# Patient Record
Sex: Female | Born: 1980
Health system: Southern US, Community
[De-identification: ages and names within clinical notes are randomized; demographics above are authoritative.]

## PROBLEM LIST (undated history)

## (undated) DIAGNOSIS — I10 Essential (primary) hypertension: Secondary | ICD-10-CM

## (undated) DIAGNOSIS — J45909 Unspecified asthma, uncomplicated: Secondary | ICD-10-CM

## (undated) DIAGNOSIS — D649 Anemia, unspecified: Secondary | ICD-10-CM

## (undated) DIAGNOSIS — K259 Gastric ulcer, unspecified as acute or chronic, without hemorrhage or perforation: Secondary | ICD-10-CM

## (undated) HISTORY — DX: Anemia, unspecified: D64.9

## (undated) HISTORY — DX: Unspecified asthma, uncomplicated: J45.909

## (undated) HISTORY — DX: Essential (primary) hypertension: I10

## (undated) HISTORY — PX: ABDOMINAL HYSTERECTOMY: SHX81

## (undated) HISTORY — DX: Gastric ulcer, unspecified as acute or chronic, without hemorrhage or perforation: K25.9

---

## 2019-03-15 ENCOUNTER — Ambulatory Visit (INDEPENDENT_AMBULATORY_CARE_PROVIDER_SITE_OTHER): Payer: No Typology Code available for payment source | Admitting: Medical

## 2019-03-15 ENCOUNTER — Other Ambulatory Visit: Payer: Self-pay

## 2019-03-15 ENCOUNTER — Encounter: Payer: Self-pay | Admitting: Medical

## 2019-03-15 VITALS — BP 134/87 | HR 72 | Temp 98.5°F | Resp 16 | Ht 66.0 in | Wt 214.8 lb

## 2019-03-15 DIAGNOSIS — E66811 Obesity, class 1: Secondary | ICD-10-CM

## 2019-03-15 DIAGNOSIS — J309 Allergic rhinitis, unspecified: Secondary | ICD-10-CM | POA: Diagnosis not present

## 2019-03-15 DIAGNOSIS — I10 Essential (primary) hypertension: Secondary | ICD-10-CM

## 2019-03-15 DIAGNOSIS — R739 Hyperglycemia, unspecified: Secondary | ICD-10-CM

## 2019-03-15 DIAGNOSIS — L309 Dermatitis, unspecified: Secondary | ICD-10-CM | POA: Diagnosis not present

## 2019-03-15 DIAGNOSIS — D649 Anemia, unspecified: Secondary | ICD-10-CM | POA: Diagnosis not present

## 2019-03-15 DIAGNOSIS — J4520 Mild intermittent asthma, uncomplicated: Secondary | ICD-10-CM | POA: Diagnosis not present

## 2019-03-15 DIAGNOSIS — E669 Obesity, unspecified: Secondary | ICD-10-CM

## 2019-03-15 LAB — COMPREHENSIVE METABOLIC PANEL
ALT: 77 U/L — ABNORMAL HIGH (ref 0–35)
AST: 49 U/L — ABNORMAL HIGH (ref 0–37)
Albumin: 4.2 g/dL (ref 3.5–5.2)
Alkaline Phosphatase: 118 U/L — ABNORMAL HIGH (ref 39–117)
BUN: 9 mg/dL (ref 6–23)
CO2: 27 mEq/L (ref 19–32)
Calcium: 9.3 mg/dL (ref 8.4–10.5)
Chloride: 105 mEq/L (ref 96–112)
Creatinine, Ser: 0.72 mg/dL (ref 0.40–1.20)
GFR: 109.71 mL/min (ref >60.00)
Glucose, Bld: 91 mg/dL (ref 70–99)
Potassium: 4.4 mEq/L (ref 3.5–5.1)
Sodium: 140 mEq/L (ref 135–145)
Total Bilirubin: 0.4 mg/dL (ref 0.2–1.2)
Total Protein: 7.3 g/dL (ref 6.0–8.3)

## 2019-03-15 LAB — LIPID PANEL
Cholesterol: 140 mg/dL (ref 0–200)
HDL: 49.3 mg/dL (ref >39.00)
LDL Cholesterol: 70 mg/dL (ref 0–99)
NonHDL: 90.98
Total CHOL/HDL Ratio: 3
Triglycerides: 103 mg/dL (ref 0.0–149.0)
VLDL: 20.6 mg/dL (ref 0.0–40.0)

## 2019-03-15 LAB — CBC WITH DIFFERENTIAL/PLATELET
Basophils Absolute: 0 10*3/uL (ref 0.0–0.1)
Basophils Relative: 0.4 % (ref 0.0–3.0)
Eosinophils Absolute: 0 10*3/uL (ref 0.0–0.7)
Eosinophils Relative: 0.5 % (ref 0.0–5.0)
HCT: 39.1 % (ref 36.0–46.0)
Hemoglobin: 12.7 g/dL (ref 12.0–15.0)
Lymphocytes Relative: 24.3 % (ref 12.0–46.0)
Lymphs Abs: 1.7 10*3/uL (ref 0.7–4.0)
MCHC: 32.3 g/dL (ref 30.0–36.0)
MCV: 84.1 fl (ref 78.0–100.0)
Monocytes Absolute: 0.4 10*3/uL (ref 0.1–1.0)
Monocytes Relative: 6.1 % (ref 3.0–12.0)
Neutro Abs: 4.9 10*3/uL (ref 1.4–7.7)
Neutrophils Relative %: 68.7 % (ref 43.0–77.0)
Platelets: 211 10*3/uL (ref 150.0–400.0)
RBC: 4.66 Mil/uL (ref 3.87–5.11)
RDW: 13.7 % (ref 11.5–15.5)
WBC: 7.1 10*3/uL (ref 4.0–10.5)

## 2019-03-15 LAB — HEMOGLOBIN A1C: Hgb A1c MFr Bld: 6.1 % (ref 4.6–6.5)

## 2019-03-15 MED ORDER — FLUTICASONE-SALMETEROL 500-50 MCG/DOSE IN AEPB: 1.0000 | INHALATION_SPRAY | Freq: Two times a day (BID) | RESPIRATORY_TRACT | 11 refills | Status: DC

## 2019-03-15 MED ORDER — HYDROCHLOROTHIAZIDE 25 MG PO TABS: 25.0000 mg | ORAL_TABLET | Freq: Every day | ORAL | 11 refills | Status: DC

## 2019-03-15 MED ORDER — LORATADINE 10 MG PO TABS: 10.0000 mg | ORAL_TABLET | Freq: Every day | ORAL | 11 refills | Status: AC

## 2019-03-15 MED ORDER — METFORMIN HCL 500 MG PO TABS: 500.0000 mg | ORAL_TABLET | Freq: Two times a day (BID) | ORAL | 3 refills | Status: DC

## 2019-03-15 MED ORDER — TRIAMCINOLONE ACETONIDE 0.1 % EX CREA: 1.0000 "application " | TOPICAL_CREAM | Freq: Two times a day (BID) | CUTANEOUS | 3 refills | Status: AC

## 2019-03-15 MED ORDER — ALBUTEROL SULFATE HFA 108 (90 BASE) MCG/ACT IN AERS: 2.0000 | INHALATION_SPRAY | Freq: Four times a day (QID) | RESPIRATORY_TRACT | 3 refills | Status: DC | PRN

## 2019-03-15 NOTE — Addendum Note (Signed)
Addended by: Anabel Halon on: 03/15/2019 10:39 AM   Modules accepted: Orders

## 2019-03-15 NOTE — Progress Notes (Signed)
Subjective:    Patient ID: Destiny Goodman, female    DOB: 1980-09-22, 38 y.o.   MRN: [id number]  HPI  Pt in for first time.  Pt former pcp in [place]. Moved from there in March. [employer] from [place].  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 Pt does exercise 3-5 miles 3 times a week.   Pt states overall feels well.   One of hx of eczema. With flares will get antecubital rash, popliteal rash and occasional rash on eye brow and neck. Pt used to use some triamcinolone in the past with flares.  Pt also has hx of asthma. She has no current inhaler. She was on adviar and abluterol. She ran out of advair last Friday.   Pt has htn. Bp today 134/87. Pt was on hctz in the past. BP ran out past march.  Pt is obese and in the past was on phentermine. She was on that for 7 months. She lost down to 183 in past. But then she gained the weight back. Pt highest weight recently at 214.  Hx of anemia when had stomach ulcer.  Pt had hysterectomy in the past due to endometriosis.  Allergic rhinitis. Worst is during weather changes.   Review of Systems  Constitutional: Negative for chills, fatigue and fever.  Respiratory: Negative for cough, chest tightness, shortness of breath and wheezing.   Cardiovascular: Negative for chest pain and palpitations.  Gastrointestinal: Negative for abdominal pain.  Genitourinary: Negative for dysuria and frequency.  Musculoskeletal: Negative for back pain, joint swelling and neck pain.  Skin: Negative for rash.       Hx of eczema.  Neurological: Negative for dizziness.  Hematological: Negative for adenopathy. Does not bruise/bleed easily.  Psychiatric/Behavioral: Negative for behavioral problems, decreased concentration and suicidal ideas. The patient is not nervous/anxious.     Past Medical History:  Diagnosis Date  . Anemia   . Asthma      Social History   Socioeconomic History  . Marital  status: Single    Spouse name: Not on file  . Number of children: Not on file  . Years of education: Not on file  . Highest education level: Not on file  Occupational History  . Not on file  Social Needs  . Financial resource strain: Not on file  . Food insecurity    Worry: Not on file    Inability: Not on file  . Transportation needs    Medical: Not on file    Non-medical: Not on file  Tobacco Use  . Smoking status: Never Smoker  . Smokeless tobacco: Never Used  Substance and Sexual Activity  . Alcohol use: Yes  . Drug use: Not on file  . Sexual activity: Not on file  Lifestyle  . Physical activity    Days per week: Not on file    Minutes per session: Not on file  . Stress: Not on file  Relationships  . Social Herbalist on phone: Not on file    Gets together: Not on file    Attends religious service: Not on file    Active member of club or organization: Not on file    Attends meetings of clubs or organizations: Not on file    Relationship status: Not on file  . Intimate partner violence    Fear of current or ex partner: Not on file    Emotionally abused:  Not on file    Physically abused: Not on file    Forced sexual activity: Not on file  Other Topics Concern  . Not on file  Social History Narrative  . Not on file    Past Surgical History:  Procedure Laterality Date  . ABDOMINAL HYSTERECTOMY      History reviewed. No pertinent family history.  Allergies  Allergen Reactions  . Cortisone Hives  . Penicillins Hives and Shortness Of Breath  . Aspirin Hives and Other (See Comments)    SENSITIVITY SENSITIVITY     Current Outpatient Medications on File Prior to Visit  Medication Sig Dispense Refill  . albuterol (VENTOLIN HFA) 108 (90 Base) MCG/ACT inhaler Inhale into the lungs.    . Fluticasone-Salmeterol (ADVAIR) 500-50 MCG/DOSE AEPB Inhale into the lungs.    . hydrochlorothiazide (HYDRODIURIL) 25 MG tablet Take by mouth.    . loratadine  (CLARITIN) 10 MG tablet Take by mouth.    . phentermine 37.5 MG capsule Take by mouth.     No current facility-administered medications on file prior to visit.     BP 134/87   Pulse 72   Temp 98.5 F (36.9 C) (Oral)   Resp 16   Ht 5\' 6"  (1.676 m)   Wt 214 lb 12.8 oz (97.4 kg)   SpO2 100%   BMI 34.67 kg/m       Objective:   Physical Exam  General Mental Status- Alert. General Appearance- Not in acute distress.   Skin General: Color- Normal Color. Moisture- Normal Moisture.  Neck Carotid Arteries- Normal color. Moisture- Normal Moisture. No carotid bruits. No JVD.  Chest and Lung Exam Auscultation: Breath Sounds:-Normal.  Cardiovascular Auscultation:Rythm- Regular. Murmurs & Other Heart Sounds:Auscultation of the heart reveals- No Murmurs.  Abdomen Inspection:-Inspeection Normal. Palpation/Percussion:Note:No mass. Palpation and Percussion of the abdomen reveal- Non Tender, Non Distended + BS, no rebound or guarding.   Neurologic Cranial Nerve exam:- CN III-XII intact(No nystagmus), symmetric smile. Strength:- 5/5 equal and symmetric strength both upper and lower extremities.       Assessment & Plan:  For history of eczema, I am making triamcinolone cream available to use for sporadic flares.  Also apply moisturizer to areas when they occur.  In addition recommend using Dove moisturizing soap.  For history of allergic rhinitis, I did refill your Claritin.  For history of asthma, I did refill Advair and albuterol rescue inhaler.  Your blood pressure is well controlled today despite the fact that you are not on your HCTZ.  I did refill that today.  Advised to continue with potassium rich diet.  For obesity, discontinue phentermine and I think good option would be metformin.  This can sometimes help with weight loss and he did have some gestational elevation of blood sugar when you were pregnant in the past.  Rx advisement given.  For history of anemia, will  get CBC today.  Also will include CMP and A1c/7468-month sugar average test today.  Follow-up in 6 weeks or as needed.  Esperanza RichtersEdward Genna Casimir, PA-C
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
## 2019-04-17 MED FILL — HYDROCHLOROTHIAZIDE 25 MG T: 25 | 30 days supply | Qty: 30 | Fill #0

## 2019-04-17 MED FILL — LORATADINE 10 MG TABS: 10 | 30 days supply | Qty: 30 | Fill #0

## 2019-04-17 MED FILL — metFORMIN HCL 500 MG TABS: 500 | 30 days supply | Qty: 60 | Fill #0

## 2019-05-02 ENCOUNTER — Ambulatory Visit (INDEPENDENT_AMBULATORY_CARE_PROVIDER_SITE_OTHER): Payer: No Typology Code available for payment source | Admitting: Medical

## 2019-05-02 ENCOUNTER — Encounter: Payer: Self-pay | Admitting: Medical

## 2019-05-02 ENCOUNTER — Other Ambulatory Visit: Payer: Self-pay

## 2019-05-02 VITALS — BP 138/77 | HR 85 | Temp 98.6°F | Ht 66.0 in | Wt 197.3 lb

## 2019-05-02 DIAGNOSIS — R739 Hyperglycemia, unspecified: Secondary | ICD-10-CM | POA: Diagnosis not present

## 2019-05-02 DIAGNOSIS — E669 Obesity, unspecified: Secondary | ICD-10-CM

## 2019-05-02 DIAGNOSIS — I10 Essential (primary) hypertension: Secondary | ICD-10-CM

## 2019-05-02 NOTE — Progress Notes (Signed)
Subjective:    Patient ID: Destiny Goodman, female    DOB: 1981/02/26, 38 y.o.   MRN: [id number]  HPI   Virtual Visit via Video Note  I connected with [facility] on 05/02/19 at  8:00 AM EDT by a video enabled telemedicine application and verified that I am speaking with the correct person using two identifiers.  Location: Patient: home Provider: office   I discussed the limitations of evaluation and management by telemedicine and the availability of in person appointments. The patient expressed understanding and agreed to proceed.  History of Present Illness:  Pt has lost about 17 lbs with exercise, diet and metformin. She has no side effects.  Pt has htn and her bp is well controlled.   Pt not had any wheezing. She states except for one day use when temp close to 100.  Pt states she was to get liposuction. She wants liposuction. She has tentative date of sept 23,2020.     Observations/Objective:  General-no acute distress, pleasant, oriented. Lungs- on inspection lungs appear unlabored. Neck- no tracheal deviation or jvd on inspection. Neuro- gross motor function appears intact.    Assessment and Plan: Your weight has come down a lot since last visit. Good job. Continue current regimen of metformin, diet and exercise.  For elevated sugar/prediabetes on lab review, recommend continue with low sugar diet and metformin.  Blood pressure well controlled.  You note during exam desire for liposuction at end of September.  This was formally plan and you know you need surgical clearance.  Please fax a form over and will review.  Would also go ahead and schedule appointment in 10 to 14 days for an office visit so we can get EKG and other studies as requested per form.  25 minutes spent with patient.  50% of time spent with patient discussing weight loss, her desired goal and her desire for liposuction end of September.  Follow Up Instructions:    I discussed the  assessment and treatment plan with the patient. The patient was provided an opportunity to ask questions and all were answered. The patient agreed with the plan and demonstrated an understanding of the instructions.   The patient was advised to call back or seek an in-person evaluation if the symptoms worsen or if the condition fails to improve as anticipated.  I provided 25  minutes of non-face-to-face time during this encounter.   Mackie Pai, PA-C    Review of Systems  Constitutional: Negative for chills, fatigue and fever.  HENT: Negative for congestion, drooling, facial swelling, nosebleeds, postnasal drip, sinus pressure and sinus pain.   Respiratory: Negative for cough, chest tightness, shortness of breath and wheezing.   Cardiovascular: Negative for chest pain and palpitations.  Gastrointestinal: Negative for abdominal pain, diarrhea, rectal pain and vomiting.  Genitourinary: Negative for decreased urine volume, difficulty urinating, dysuria and frequency.  Musculoskeletal: Negative for back pain, joint swelling and myalgias.  Skin: Negative for rash.  Neurological: Negative for dizziness, speech difficulty, weakness and headaches.  Hematological: Negative for adenopathy. Does not bruise/bleed easily.  Psychiatric/Behavioral: Negative for confusion.    Past Medical History:  Diagnosis Date  . Anemia   . Asthma   . Hypertension   . Stomach ulcer      Social History   Socioeconomic History  . Marital status: Single    Spouse name: Not on file  . Number of children: 1  . Years of education: Not on file  . Highest education level:  Not on file  Occupational History  . Occupation: Diplomatic Services operational officersecretary  Social Needs  . Financial resource strain: Not on file  . Food insecurity    Worry: Not on file    Inability: Not on file  . Transportation needs    Medical: Not on file    Non-medical: Not on file  Tobacco Use  . Smoking status: Never Smoker  . Smokeless tobacco: Never  Used  Substance and Sexual Activity  . Alcohol use: Yes    Comment: social/rare.   . Drug use: Never  . Sexual activity: Yes  Lifestyle  . Physical activity    Days per week: Not on file    Minutes per session: Not on file  . Stress: Not on file  Relationships  . Social Musicianconnections    Talks on phone: Not on file    Gets together: Not on file    Attends religious service: Not on file    Active member of club or organization: Not on file    Attends meetings of clubs or organizations: Not on file    Relationship status: Not on file  . Intimate partner violence    Fear of current or ex partner: Not on file    Emotionally abused: Not on file    Physically abused: Not on file    Forced sexual activity: Not on file  Other Topics Concern  . Not on file  Social History Narrative  . Not on file    Past Surgical History:  Procedure Laterality Date  . ABDOMINAL HYSTERECTOMY      No family history on file.  Allergies  Allergen Reactions  . Cortisone Hives  . Penicillins Hives and Shortness Of Breath  . Aspirin Hives and Other (See Comments)    SENSITIVITY SENSITIVITY     Current Outpatient Medications on File Prior to Visit  Medication Sig Dispense Refill  . albuterol (VENTOLIN HFA) 108 (90 Base) MCG/ACT inhaler Inhale 2 puffs into the lungs every 6 (six) hours as needed for wheezing or shortness of breath. 18 g 3  . Fluticasone-Salmeterol (ADVAIR) 500-50 MCG/DOSE AEPB Inhale 1 puff into the lungs 2 (two) times daily. 60 each 11  . hydrochlorothiazide (HYDRODIURIL) 25 MG tablet Take 1 tablet (25 mg total) by mouth daily. 30 tablet 11  . loratadine (CLARITIN) 10 MG tablet Take 1 tablet (10 mg total) by mouth daily. 30 tablet 11  . metFORMIN (GLUCOPHAGE) 500 MG tablet Take 1 tablet (500 mg total) by mouth 2 (two) times daily with a meal. 60 tablet 3  . triamcinolone cream (KENALOG) 0.1 % Apply 1 application topically 2 (two) times daily. 30 g 3   No current  facility-administered medications on file prior to visit.     BP 138/77   Pulse 85   Temp 98.6 F (37 C)   Ht 5\' 6"  (1.676 m)   Wt 197 lb 4.8 oz (89.5 kg)   SpO2 98%   BMI 31.85 kg/m       Objective:   Physical Exam        Assessment & Plan:

## 2019-05-02 NOTE — Patient Instructions (Addendum)
Your weight has come down a lot since last visit. Good job. Continue current regimen of metformin, diet and exercise.  For elevated sugar/prediabetes on lab review, recommend continue with low sugar diet and metformin.  Blood pressure well controlled.  You note during exam desire for liposuction at end of September.  This was formally plan and you know you need surgical clearance.  Please fax a form over and will review.  Would also go ahead and schedule appointment in 10 to 14 days for an office visit so we can get EKG and other studies as requested per form.

## 2019-05-11 ENCOUNTER — Other Ambulatory Visit: Payer: Self-pay

## 2019-05-15 ENCOUNTER — Ambulatory Visit: Payer: No Typology Code available for payment source | Admitting: Medical

## 2019-05-15 ENCOUNTER — Ambulatory Visit (HOSPITAL_BASED_OUTPATIENT_CLINIC_OR_DEPARTMENT_OTHER)
Admission: RE | Admit: 2019-05-15 | Discharge: 2019-05-15 | Disposition: A | Discharge status: Home / Self Care | Payer: No Typology Code available for payment source | Source: Ambulatory Visit | Attending: Medical | Admitting: Medical

## 2019-05-15 ENCOUNTER — Other Ambulatory Visit: Payer: Self-pay

## 2019-05-15 ENCOUNTER — Ambulatory Visit (INDEPENDENT_AMBULATORY_CARE_PROVIDER_SITE_OTHER): Payer: No Typology Code available for payment source | Admitting: Medical

## 2019-05-15 ENCOUNTER — Encounter: Payer: Self-pay | Admitting: Medical

## 2019-05-15 VITALS — BP 142/80 | HR 85 | Temp 97.2°F | Resp 16 | Ht 67.0 in | Wt 214.0 lb

## 2019-05-15 DIAGNOSIS — J4520 Mild intermittent asthma, uncomplicated: Secondary | ICD-10-CM | POA: Diagnosis present

## 2019-05-15 DIAGNOSIS — Z01818 Encounter for other preprocedural examination: Secondary | ICD-10-CM

## 2019-05-15 DIAGNOSIS — R739 Hyperglycemia, unspecified: Secondary | ICD-10-CM

## 2019-05-15 DIAGNOSIS — E669 Obesity, unspecified: Secondary | ICD-10-CM | POA: Diagnosis present

## 2019-05-15 DIAGNOSIS — I10 Essential (primary) hypertension: Secondary | ICD-10-CM

## 2019-05-15 DIAGNOSIS — Z113 Encounter for screening for infections with a predominantly sexual mode of transmission: Secondary | ICD-10-CM

## 2019-05-15 DIAGNOSIS — E66811 Obesity, class 1: Secondary | ICD-10-CM

## 2019-05-15 LAB — POC URINALSYSI DIPSTICK (AUTOMATED)
Bilirubin, UA: NEGATIVE
Blood, UA: NEGATIVE
Glucose, UA: NEGATIVE
Ketones, UA: NEGATIVE
Leukocytes, UA: NEGATIVE
Nitrite, UA: NEGATIVE
Protein, UA: NEGATIVE
Spec Grav, UA: 1.02 (ref 1.010–1.025)
Urobilinogen, UA: 0.2 E.U./dL
pH, UA: 6 (ref 5.0–8.0)

## 2019-05-15 LAB — CBC WITH DIFFERENTIAL/PLATELET
Absolute Monocytes: 648 cells/uL (ref 200–950)
Basophils Absolute: 27 cells/uL (ref 0–200)
Basophils Relative: 0.3 %
Eosinophils Absolute: 99 cells/uL (ref 15–500)
Eosinophils Relative: 1.1 %
HCT: 37.2 % (ref 35.0–45.0)
Hemoglobin: 12.1 g/dL (ref 11.7–15.5)
Lymphs Abs: 2925 cells/uL (ref 850–3900)
MCH: 26.8 pg — ABNORMAL LOW (ref 27.0–33.0)
MCHC: 32.5 g/dL (ref 32.0–36.0)
MCV: 82.5 fL (ref 80.0–100.0)
MPV: 12.8 fL — ABNORMAL HIGH (ref 7.5–12.5)
Monocytes Relative: 7.2 %
Neutro Abs: 5301 cells/uL (ref 1500–7800)
Neutrophils Relative %: 58.9 %
Platelets: 229 10*3/uL (ref 140–400)
RBC: 4.51 10*6/uL (ref 3.80–5.10)
RDW: 13.4 % (ref 11.0–15.0)
Total Lymphocyte: 32.5 %
WBC: 9 10*3/uL (ref 3.8–10.8)

## 2019-05-15 NOTE — Patient Instructions (Addendum)
We did preop physical exam today and will get associated labs requested by the surgeon that will do your elective surgery.  ekg normal sinus rhythm.  Your blood pressure is borderline elevated today but you report better readings at your house.  Continue current BP medication regimen but check your blood pressure 3-4 times a week.  If it is above the 130/80 range then will need to add another medication before your surgery.  You have mild intermittent asthma in the past.  None recently but will go ahead and get chest x-ray in light of your upcoming surgery.  Mend that you follow through with postop monitor plan close to [facility] since surgeon will be available.  If you have later complications it would be challenge to get you followed up locally with other plastic surgeon.  Follow up or as needed  It was brought to my attention that ekg not in epic. I read hard copy print out on day of visit. Found pt ekg in shred basket and reviewed again. Normal sinus rhythm. I am signing original print out. Placing the ekg to be scanned to media.

## 2019-05-15 NOTE — Progress Notes (Signed)
Subjective:    Patient ID: Destiny Goodman, female    DOB: March 21, 1981, 38 y.o.   MRN: [id number]  HPI  Pt in for pre-op cpe.  Pt wants to have surgery with Dr. Lenn SinkGiorgis number [phone].  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  Pt wants to have this end of September around 21st. Will be in [place] until June 19, 2019.   Pt states plan to do 360 transfer. Will transfer fat abd area to buttock and upper thigh area per pt./  Pt surgeon has own surgical center. Pt will get screened for covid before procedure.  Pt has hx of asthma on chart review. She states usually has to use inhalers at beginning of summer but non recently.    Pt sugar has been in prediabetic range. On metformin.  LMP-hysterectomy.  Pt tolerated general anesthesia well for rototor cuff, bicept tendon repair, breast reduction and hysterectomy.  No family history of sudden death young age.   Pt work for [employer].  Pt has htn.. Last night 132/84.  No hx of dvt or pe.    ... Review of Systems  Constitutional: Negative for chills, fatigue and fever.  Respiratory: Negative for chest tightness, shortness of breath and stridor.   Cardiovascular: Negative for chest pain and palpitations.  Gastrointestinal: Negative for abdominal pain.  Musculoskeletal: Negative for back pain.  Skin: Negative for rash.  Neurological: Negative for dizziness, seizures, weakness and light-headedness.  Hematological: Negative for adenopathy. Does not bruise/bleed easily.  Psychiatric/Behavioral: Negative for behavioral problems and confusion.    Past Medical History:  Diagnosis Date  . Anemia   . Asthma   . Hypertension   . Stomach ulcer      Social History   Socioeconomic History  . Marital status: Single    Spouse name: Not on file  . Number of children: 1  . Years of education: Not on file  . Highest education level: Not on file  Occupational History  . Occupation: Diplomatic Services operational officersecretary  Social Needs  .  Financial resource strain: Not on file  . Food insecurity    Worry: Not on file    Inability: Not on file  . Transportation needs    Medical: Not on file    Non-medical: Not on file  Tobacco Use  . Smoking status: Never Smoker  . Smokeless tobacco: Never Used  Substance and Sexual Activity  . Alcohol use: Yes    Comment: social/rare.   . Drug use: Never  . Sexual activity: Yes  Lifestyle  . Physical activity    Days per week: Not on file    Minutes per session: Not on file  . Stress: Not on file  Relationships  . Social Musicianconnections    Talks on phone: Not on file    Gets together: Not on file    Attends religious service: Not on file    Active member of club or organization: Not on file    Attends meetings of clubs or organizations: Not on file    Relationship status: Not on file  . Intimate partner violence    Fear of current or ex partner: Not on file    Emotionally abused: Not on file    Physically abused: Not on file    Forced sexual activity: Not on file  Other Topics Concern  . Not on file  Social History Narrative  . Not on file    Past Surgical History:  Procedure Laterality Date  . ABDOMINAL  HYSTERECTOMY      No family history on file.  Allergies  Allergen Reactions  . Cortisone Hives  . Penicillins Hives and Shortness Of Breath  . Aspirin Hives and Other (See Comments)    SENSITIVITY SENSITIVITY     Current Outpatient Medications on File Prior to Visit  Medication Sig Dispense Refill  . albuterol (VENTOLIN HFA) 108 (90 Base) MCG/ACT inhaler Inhale 2 puffs into the lungs every 6 (six) hours as needed for wheezing or shortness of breath. 18 g 3  . Fluticasone-Salmeterol (ADVAIR) 500-50 MCG/DOSE AEPB Inhale 1 puff into the lungs 2 (two) times daily. 60 each 11  . hydrochlorothiazide (HYDRODIURIL) 25 MG tablet Take 1 tablet (25 mg total) by mouth daily. 30 tablet 11  . loratadine (CLARITIN) 10 MG tablet Take 1 tablet (10 mg total) by mouth daily. 30  tablet 11  . metFORMIN (GLUCOPHAGE) 500 MG tablet Take 1 tablet (500 mg total) by mouth 2 (two) times daily with a meal. 60 tablet 3  . triamcinolone cream (KENALOG) 0.1 % Apply 1 application topically 2 (two) times daily. 30 g 3   No current facility-administered medications on file prior to visit.     BP (!) 144/87   Pulse 85   Temp (!) 97.2 F (36.2 C) (Temporal)   Resp 16   Ht 5\' 7"  (1.702 m)   Wt 214 lb (97.1 kg)   SpO2 100%   BMI 33.52 kg/m       Objective:   Physical Exam  General Mental Status- Alert. General Appearance- Not in acute distress.   Skin General: Color- Normal Color. Moisture- Normal Moisture.  Neck Carotid Arteries- Normal color. Moisture- Normal Moisture. No carotid bruits. No JVD.  Chest and Lung Exam Auscultation: Breath Sounds:-Normal.  Cardiovascular Auscultation:Rythm- Regular. Murmurs & Other Heart Sounds:Auscultation of the heart reveals- No Murmurs.  Abdomen Inspection:-Inspeection Normal. Palpation/Percussion:Note:No mass. Palpation and Percussion of the abdomen reveal- Non Tender, Non Distended + BS, no rebound or guarding.    Neurologic Cranial Nerve exam:- CN III-XII intact(No nystagmus), symmetric smile. Strength:- 5/5 equal and symmetric strength both upper and lower extremities.  Lower ext- no pedal edema.      Assessment & Plan:  We did preop physical exam today and will get associated labs requested by the surgeon that will do your elective surgery.  ekg normal sinus rhythm.  Your blood pressure is borderline elevated today but you report better readings at your house.  Continue current BP medication regimen but check your blood pressure 3-4 times a week.  If it is above the 130/80 range then will need to add another medication before your surgery.  You have mild intermittent asthma in the past.  None recently but will go ahead and get chest x-ray in light of your upcoming surgery.  Mend that you follow through  with postop monitor plan close to [facility] since surgeon will be available.  If you have later complications it would be challenge to get you followed up locally with other plastic surgeon.  Follow up or as needed  40 minutes spent with pt. 50% of time spent counseling on chronic condition hx as well as her upcoming elective surgery in [place].  Mackie Pai, PA-C

## 2019-05-16 ENCOUNTER — Telehealth: Payer: Self-pay | Admitting: Medical

## 2019-05-16 DIAGNOSIS — R748 Abnormal levels of other serum enzymes: Secondary | ICD-10-CM

## 2019-05-16 LAB — COMPREHENSIVE METABOLIC PANEL
AG Ratio: 1.2 (calc) (ref 1.0–2.5)
ALT: 99 U/L — ABNORMAL HIGH (ref 6–29)
AST: 60 U/L — ABNORMAL HIGH (ref 10–30)
Albumin: 4.1 g/dL (ref 3.6–5.1)
Alkaline phosphatase (APISO): 106 U/L (ref 31–125)
BUN/Creatinine Ratio: 9 (calc) (ref 6–22)
BUN: 6 mg/dL — ABNORMAL LOW (ref 7–25)
CO2: 22 mmol/L (ref 20–32)
Calcium: 9.8 mg/dL (ref 8.6–10.2)
Chloride: 106 mmol/L (ref 98–110)
Creat: 0.66 mg/dL (ref 0.50–1.10)
Globulin: 3.3 g/dL (calc) (ref 1.9–3.7)
Glucose, Bld: 78 mg/dL (ref 65–99)
Potassium: 3.8 mmol/L (ref 3.5–5.3)
Sodium: 141 mmol/L (ref 135–146)
Total Bilirubin: 0.4 mg/dL (ref 0.2–1.2)
Total Protein: 7.4 g/dL (ref 6.1–8.1)

## 2019-05-16 LAB — HIV ANTIBODY (ROUTINE TESTING W REFLEX): HIV 1&2 Ab, 4th Generation: NONREACTIVE

## 2019-05-16 LAB — PROTIME-INR
INR: 1 ratio (ref 0.8–1.0)
Prothrombin Time: 12.2 s (ref 9.6–13.1)

## 2019-05-16 LAB — APTT: aPTT: 30.4 s (ref 23.4–32.7)

## 2019-05-16 NOTE — Telephone Encounter (Signed)
Future abdomen ultrasound placed.

## 2019-05-23 ENCOUNTER — Encounter: Payer: Self-pay | Admitting: Medical

## 2019-05-23 ENCOUNTER — Telehealth: Payer: Self-pay | Admitting: Medical

## 2019-05-23 NOTE — Telephone Encounter (Signed)
Filled out pt preop- clearance form. Placed it on your desk(jasmine) Would you fax it first thing tomorrow. Pt has deadline to get paperwork to surgeon office by noon.   She sent me my chart message.  The sheet says 30 days before surgery. Which is 06-05-2019. I saw her on 05-15-2019. Even then the paperwork was late.

## 2019-05-25 ENCOUNTER — Telehealth: Payer: Self-pay

## 2019-05-25 ENCOUNTER — Telehealth: Payer: Self-pay | Admitting: Medical

## 2019-05-25 DIAGNOSIS — N912 Amenorrhea, unspecified: Secondary | ICD-10-CM

## 2019-05-25 NOTE — Telephone Encounter (Signed)
Spoke w/ Pt- she stated that B-HCG is not for pregnancy but for hormone levels. Lab appt scheduled for tomorrow.

## 2019-05-25 NOTE — Telephone Encounter (Signed)
Future hcg test placed. At request of pt . Pt has had hysterectomy. So asking kaylyn to pass message that she tell surgeon office about her hysterectomy.  I put in dx ammenorhea as no screening pregnancy dx etc.

## 2019-05-25 NOTE — Telephone Encounter (Signed)
Pt called and stated that surgeon is requesting BHCG quantitative lab done. Pt would like to do this as soon as possible. Please advise

## 2019-05-25 NOTE — Telephone Encounter (Signed)
Please advise 

## 2019-05-25 NOTE — Telephone Encounter (Signed)
Pt told me she had hysterectomy. So not needed? Order placed but advise pt she should communicate fact of hysterectomy to surgeon office .  If she will get can you help get her scheduled.  Thanks for your help.

## 2019-05-25 NOTE — Telephone Encounter (Signed)
Caryn with [facility] transferred pt to me. I have faxed last 3 OV notes, recent labs, and [place] from 03/2015 to below as requested.

## 2019-05-25 NOTE — Telephone Encounter (Signed)
Copied from [place] [id number]. Topic: General - Other >> May 24, 2019  9:50 AM Yvette Rack wrote: Reason for CRM: Jimmy with [facility] requests a copy of pt most recent labs and progress notes be faxed to [phone]

## 2019-05-26 ENCOUNTER — Other Ambulatory Visit (INDEPENDENT_AMBULATORY_CARE_PROVIDER_SITE_OTHER): Payer: No Typology Code available for payment source

## 2019-05-26 ENCOUNTER — Other Ambulatory Visit: Payer: Self-pay

## 2019-05-26 DIAGNOSIS — N912 Amenorrhea, unspecified: Secondary | ICD-10-CM | POA: Diagnosis not present

## 2019-05-26 LAB — HCG, QUANTITATIVE, PREGNANCY: Quantitative HCG: 2.07 m[IU]/mL

## 2019-06-02 ENCOUNTER — Ambulatory Visit (HOSPITAL_BASED_OUTPATIENT_CLINIC_OR_DEPARTMENT_OTHER)
Admission: RE | Admit: 2019-06-02 | Discharge: 2019-06-02 | Disposition: A | Discharge status: Home / Self Care | Payer: No Typology Code available for payment source | Source: Ambulatory Visit | Attending: Medical | Admitting: Medical

## 2019-06-02 ENCOUNTER — Other Ambulatory Visit: Payer: Self-pay

## 2019-06-02 DIAGNOSIS — R748 Abnormal levels of other serum enzymes: Secondary | ICD-10-CM

## 2019-07-05 MED FILL — metFORMIN HCL 500 MG TABS: 500 | 30 days supply | Qty: 60 | Fill #1

## 2019-07-05 MED FILL — HYDROCHLOROTHIAZIDE 25 MG T: 25 | 30 days supply | Qty: 30 | Fill #1

## 2019-07-05 MED FILL — SM LORATADINE 10 MG TABS: 10 | 90 days supply | Qty: 90 | Fill #1

## 2019-11-15 ENCOUNTER — Other Ambulatory Visit: Payer: Self-pay

## 2019-11-15 ENCOUNTER — Ambulatory Visit (INDEPENDENT_AMBULATORY_CARE_PROVIDER_SITE_OTHER): Payer: No Typology Code available for payment source | Admitting: Medical

## 2019-11-15 ENCOUNTER — Encounter: Payer: Self-pay | Admitting: Medical

## 2019-11-15 VITALS — BP 138/89 | HR 78 | Temp 98.7°F

## 2019-11-15 DIAGNOSIS — R739 Hyperglycemia, unspecified: Secondary | ICD-10-CM | POA: Diagnosis not present

## 2019-11-15 DIAGNOSIS — R05 Cough: Secondary | ICD-10-CM | POA: Diagnosis not present

## 2019-11-15 DIAGNOSIS — R059 Cough, unspecified: Secondary | ICD-10-CM

## 2019-11-15 MED ORDER — FLUTICASONE-SALMETEROL 500-50 MCG/DOSE IN AEPB: 1.0000 | INHALATION_SPRAY | Freq: Two times a day (BID) | RESPIRATORY_TRACT | 11 refills | Status: DC

## 2019-11-15 MED ORDER — HYDROCODONE-HOMATROPINE 5-1.5 MG/5ML PO SYRP: 5.0000 mL | ORAL_SOLUTION | Freq: Four times a day (QID) | ORAL | 0 refills | Status: AC | PRN

## 2019-11-15 MED ORDER — METFORMIN HCL 500 MG PO TABS: 500.0000 mg | ORAL_TABLET | Freq: Two times a day (BID) | ORAL | 3 refills | Status: DC

## 2019-11-15 MED ORDER — ALBUTEROL SULFATE HFA 108 (90 BASE) MCG/ACT IN AERS: 2.0000 | INHALATION_SPRAY | Freq: Four times a day (QID) | RESPIRATORY_TRACT | 3 refills | Status: DC | PRN

## 2019-11-15 MED ORDER — AZITHROMYCIN 250 MG PO TABS: ORAL_TABLET | ORAL | 0 refills | Status: AC

## 2019-11-15 MED FILL — HYDROCODONE-HOMATROPINE SYR: 5-1.5 | 5 days supply | Qty: 100 | Fill #0

## 2019-11-15 MED FILL — ALBUTEROL SULFATE HFA 108 (: 108 (90 BAS | 25 days supply | Qty: 18 | Fill #0

## 2019-11-15 MED FILL — ADVAIR 500/50 DISKUS: 500-50 | 30 days supply | Qty: 60 | Fill #0

## 2019-11-15 MED FILL — AZITHROMYCIN 250 MG TABLET: 250 | 5 days supply | Qty: 6 | Fill #0

## 2019-11-15 MED FILL — metFORMIN HCL 500 MG TABS: 500 | 30 days supply | Qty: 60 | Fill #0
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
## 2019-11-16 NOTE — Patient Instructions (Signed)
You have recent bronchitis(with possible allergy component) type signs and symptom and hx of reactive airways. RX zpack, hycodan(rx advisement), continue inhalers and allergra.   If wheezing worsens consider taper prednisone.  For prediabetes refilled metformin. Future cmp and a1c placed.  2 negative covid test past month. I don't think repeat test indicated. Answer work screening question and if they want you retested follow thru.  Follow up 7-10 days or as needed

## 2020-02-07 MED FILL — traMADol HCL 50 MG TABS: 50 | 6 days supply | Qty: 35 | Fill #0

## 2020-02-07 MED FILL — HYDROCHLOROTHIAZIDE 25 MG T: 25 | 30 days supply | Qty: 30 | Fill #1

## 2020-02-07 MED FILL — ALBUTEROL SULFATE HFA 108 (: 108 (90 BAS | 25 days supply | Qty: 18 | Fill #1

## 2020-02-07 MED FILL — CYCLOBENZAPRINE HCL 10 MG T: 10 | 15 days supply | Qty: 45 | Fill #0

## 2020-02-07 MED FILL — ADVAIR 500/50 DISKUS: 500-50 | 30 days supply | Qty: 60 | Fill #0

## 2020-02-07 MED FILL — METFORMIN HCL 500 MG TABS: 500 | 30 days supply | Qty: 60 | Fill #1

## 2020-02-07 MED FILL — LORATADINE 10 MG TABS: 10 | 30 days supply | Qty: 30 | Fill #1

## 2020-02-14 ENCOUNTER — Encounter: Payer: No Typology Code available for payment source | Admitting: Medical

## 2020-02-18 ENCOUNTER — Encounter: Payer: Self-pay | Admitting: Medical

## 2020-03-04 ENCOUNTER — Ambulatory Visit (INDEPENDENT_AMBULATORY_CARE_PROVIDER_SITE_OTHER): Payer: No Typology Code available for payment source | Admitting: Medical

## 2020-03-04 ENCOUNTER — Telehealth: Payer: Self-pay | Admitting: Medical

## 2020-03-04 ENCOUNTER — Other Ambulatory Visit: Payer: Self-pay

## 2020-03-04 VITALS — BP 149/91 | HR 65 | Resp 18 | Ht 64.0 in | Wt 217.4 lb

## 2020-03-04 DIAGNOSIS — R739 Hyperglycemia, unspecified: Secondary | ICD-10-CM

## 2020-03-04 DIAGNOSIS — Z0000 Encounter for general adult medical examination without abnormal findings: Secondary | ICD-10-CM

## 2020-03-04 LAB — HEMOGLOBIN A1C: Hgb A1c MFr Bld: 6.5 % (ref 4.6–6.5)

## 2020-03-04 MED ORDER — METFORMIN HCL 500 MG PO TABS: 500.0000 mg | ORAL_TABLET | Freq: Two times a day (BID) | ORAL | 11 refills | Status: DC

## 2020-03-04 NOTE — Progress Notes (Signed)
Subjective:    Patient ID: Destiny Goodman, female    DOB: 28-Dec-1980, 39 y.o.   MRN: [id number]  HPI  Pt in for cpe/wellness exam.  Pt is fasting today. Pt is exercising daily walking around her neighborhood and going to gym 3 days a week. Pt states healthy diet. Eating less recently. No alcohol use. Non smoker.  Pt got covid vaccine series. Back in February per pt.       Review of Systems  Constitutional: Negative for chills, fatigue and fever.  HENT: Negative for congestion, ear discharge and postnasal drip.   Respiratory: Negative for cough, chest tightness, shortness of breath and wheezing.   Cardiovascular: Negative for chest pain and palpitations.  Gastrointestinal: Negative for abdominal pain.  Genitourinary: Negative for difficulty urinating, dyspareunia, flank pain, frequency, hematuria and urgency.  Musculoskeletal: Negative for back pain and myalgias.  Skin: Negative for rash.  Neurological: Negative for dizziness, speech difficulty, numbness and headaches.  Hematological: Negative for adenopathy. Does not bruise/bleed easily.  Psychiatric/Behavioral: Negative for behavioral problems and dysphoric mood. The patient is not nervous/anxious.     Past Medical History:  Diagnosis Date  . Anemia   . Asthma   . Hypertension   . Stomach ulcer      Social History   Socioeconomic History  . Marital status: Single    Spouse name: Not on file  . Number of children: 1  . Years of education: Not on file  . Highest education level: Not on file  Occupational History  . Occupation: Diplomatic Services operational officer  Tobacco Use  . Smoking status: Never Smoker  . Smokeless tobacco: Never Used  Vaping Use  . Vaping Use: Never used  Substance and Sexual Activity  . Alcohol use: Yes    Comment: social/rare.   . Drug use: Never  . Sexual activity: Yes  Other Topics Concern  . Not on file  Social History Narrative  . Not on file   Social Determinants of Health   Financial Resource  Strain:   . Difficulty of Paying Living Expenses:   Food Insecurity:   . Worried About Programme researcher, broadcasting/film/video in the Last Year:   . Barista in the Last Year:   Transportation Needs:   . Freight forwarder (Medical):   Marland Kitchen Lack of Transportation (Non-Medical):   Physical Activity:   . Days of Exercise per Week:   . Minutes of Exercise per Session:   Stress:   . Feeling of Stress :   Social Connections:   . Frequency of Communication with Friends and Family:   . Frequency of Social Gatherings with Friends and Family:   . Attends Religious Services:   . Active Member of Clubs or Organizations:   . Attends Banker Meetings:   Marland Kitchen Marital Status:   Intimate Partner Violence:   . Fear of Current or Ex-Partner:   . Emotionally Abused:   Marland Kitchen Physically Abused:   . Sexually Abused:     Past Surgical History:  Procedure Laterality Date  . ABDOMINAL HYSTERECTOMY      No family history on file.  Allergies  Allergen Reactions  . Cortisone Hives  . Penicillins Hives and Shortness Of Breath  . Aspirin Hives and Other (See Comments)    SENSITIVITY SENSITIVITY     Current Outpatient Medications on File Prior to Visit  Medication Sig Dispense Refill  . albuterol (VENTOLIN HFA) 108 (90 Base) MCG/ACT inhaler Inhale 2 puffs into the lungs every  6 (six) hours as needed for wheezing or shortness of breath. 18 g 3  . azithromycin (ZITHROMAX) 250 MG tablet Take 2 tablets by mouth on day 1, followed by 1 tablet by mouth daily for 4 days. (Patient not taking: Reported on 03/04/2020) 6 tablet 0  . Fluticasone-Salmeterol (ADVAIR) 500-50 MCG/DOSE AEPB Inhale 1 puff into the lungs 2 (two) times daily. 60 each 11  . hydrochlorothiazide (HYDRODIURIL) 25 MG tablet Take 1 tablet (25 mg total) by mouth daily. 30 tablet 11  . HYDROcodone-homatropine (HYCODAN) 5-1.5 MG/5ML syrup Take 5 mLs by mouth every 6 (six) hours as needed. 100 mL 0  . loratadine (CLARITIN) 10 MG tablet Take 1 tablet  (10 mg total) by mouth daily. 30 tablet 11  . metFORMIN (GLUCOPHAGE) 500 MG tablet Take 1 tablet (500 mg total) by mouth 2 (two) times daily with a meal. 60 tablet 3  . triamcinolone cream (KENALOG) 0.1 % Apply 1 application topically 2 (two) times daily. 30 g 3   No current facility-administered medications on file prior to visit.    BP (!) 149/91 (BP Location: Left Arm, Patient Position: Sitting, Cuff Size: Large)   Pulse 65   Resp 18   Ht 5\' 4"  (1.626 m)   Wt 217 lb 6.4 oz (98.6 kg)   SpO2 100%   BMI 37.32 kg/m       Objective:   Physical Exam   General Mental Status- Alert. General Appearance- Not in acute distress.   Skin General: Color- Normal Color. Moisture- Normal Moisture.  Neck Carotid Arteries- Normal color. Moisture- Normal Moisture. No carotid bruits. No JVD.  Chest and Lung Exam Auscultation: Breath Sounds:-Normal.  Cardiovascular Auscultation:Rythm- Regular. Murmurs & Other Heart Sounds:Auscultation of the heart reveals- No Murmurs.  Abdomen Inspection:-Inspeection Normal. Palpation/Percussion:Note:No mass. Palpation and Percussion of the abdomen reveal- Non Tender, Non Distended + BS, no rebound or guarding.    Neurologic Cranial Nerve exam:- CN III-XII intact(No nystagmus), symmetric smile. Strength:- 5/5 equal and symmetric strength both upper and lower extremities.  heent- normal but small amount wax rt ear canal.     Assessment & Plan:  For you wellness exam today I have ordered cbc, cmp and  lipid panel.  Vaccine up to date  Recommend exercise and healthy diet.  We will let you know lab results as they come in.  Follow up date appointment will be determined after lab review.   You have htn. bp elevated. But did not take hctz last night. Want you to make sure taking every 24 hours and check bp at home. If bp higher than 140/90 then would add low dose losartan to your regimen.  Mild wax to rt ear. Can use otc debrox.   Mackie Pai, PA-C

## 2020-03-04 NOTE — Patient Instructions (Addendum)
For you wellness exam today I have ordered cbc, cmp and  lipid panel.  Vaccine up to date  Recommend exercise and healthy diet.  We will let you know lab results as they come in.  Follow up date appointment will be determined after lab review.    You have htn. bp elevated. But did not take hctz last night. Want you to make sure taking every 24 hours and check bp at home. If bp higher than 140/90 then would add low dose losartan to your regimen.  Mild wax to rt ear. Can use otc debrox.    Preventive Care 59-58 Years Old, Female Preventive care refers to visits with your health care provider and lifestyle choices that can promote health and wellness. This includes:  A yearly physical exam. This may also be called an annual well check.  Regular dental visits and eye exams.  Immunizations.  Screening for certain conditions.  Healthy lifestyle choices, such as eating a healthy diet, getting regular exercise, not using drugs or products that contain nicotine and tobacco, and limiting alcohol use. What can I expect for my preventive care visit? Physical exam Your health care provider will check your:  Height and weight. This may be used to calculate body mass index (BMI), which tells if you are at a healthy weight.  Heart rate and blood pressure.  Skin for abnormal spots. Counseling Your health care provider may ask you questions about your:  Alcohol, tobacco, and drug use.  Emotional well-being.  Home and relationship well-being.  Sexual activity.  Eating habits.  Work and work Statistician.  Method of birth control.  Menstrual cycle.  Pregnancy history. What immunizations do I need?  Influenza (flu) vaccine  This is recommended every year. Tetanus, diphtheria, and pertussis (Tdap) vaccine  You may need a Td booster every 10 years. Varicella (chickenpox) vaccine  You may need this if you have not been vaccinated. Human papillomavirus (HPV) vaccine  If  recommended by your health care provider, you may need three doses over 6 months. Measles, mumps, and rubella (MMR) vaccine  You may need at least one dose of MMR. You may also need a second dose. Meningococcal conjugate (MenACWY) vaccine  One dose is recommended if you are age 63-21 years and a first-year college student living in a residence hall, or if you have one of several medical conditions. You may also need additional booster doses. Pneumococcal conjugate (PCV13) vaccine  You may need this if you have certain conditions and were not previously vaccinated. Pneumococcal polysaccharide (PPSV23) vaccine  You may need one or two doses if you smoke cigarettes or if you have certain conditions. Hepatitis A vaccine  You may need this if you have certain conditions or if you travel or work in places where you may be exposed to hepatitis A. Hepatitis B vaccine  You may need this if you have certain conditions or if you travel or work in places where you may be exposed to hepatitis B. Haemophilus influenzae type b (Hib) vaccine  You may need this if you have certain conditions. You may receive vaccines as individual doses or as more than one vaccine together in one shot (combination vaccines). Talk with your health care provider about the risks and benefits of combination vaccines. What tests do I need?  Blood tests  Lipid and cholesterol levels. These may be checked every 5 years starting at age 61.  Hepatitis C test.  Hepatitis B test. Screening  Diabetes screening. This  is done by checking your blood sugar (glucose) after you have not eaten for a while (fasting).  Sexually transmitted disease (STD) testing.  BRCA-related cancer screening. This may be done if you have a family history of breast, ovarian, tubal, or peritoneal cancers.  Pelvic exam and Pap test. This may be done every 3 years starting at age 21. Starting at age 63, this may be done every 5 years if you have a  Pap test in combination with an HPV test. Talk with your health care provider about your test results, treatment options, and if necessary, the need for more tests. Follow these instructions at home: Eating and drinking   Eat a diet that includes fresh fruits and vegetables, whole grains, lean protein, and low-fat dairy.  Take vitamin and mineral supplements as recommended by your health care provider.  Do not drink alcohol if: ? Your health care provider tells you not to drink. ? You are pregnant, may be pregnant, or are planning to become pregnant.  If you drink alcohol: ? Limit how much you have to 0-1 drink a day. ? Be aware of how much alcohol is in your drink. In the [place] one drink equals one 12 oz bottle of beer (355 mL), one 5 oz glass of wine (148 mL), or one 1 oz glass of hard liquor (44 mL). Lifestyle  Take daily care of your teeth and gums.  Stay active. Exercise for at least 30 minutes on 5 or more days each week.  Do not use any products that contain nicotine or tobacco, such as cigarettes, e-cigarettes, and chewing tobacco. If you need help quitting, ask your health care provider.  If you are sexually active, practice safe sex. Use a condom or other form of birth control (contraception) in order to prevent pregnancy and STIs (sexually transmitted infections). If you plan to become pregnant, see your health care provider for a preconception visit. What's next?  Visit your health care provider once a year for a well check visit.  Ask your health care provider how often you should have your eyes and teeth checked.  Stay up to date on all vaccines. This information is not intended to replace advice given to you by your health care provider. Make sure you discuss any questions you have with your health care provider. Document Revised: 05/12/2018 Document Reviewed: 05/12/2018 Elsevier Patient Education  2020 [employer].

## 2020-03-04 NOTE — Telephone Encounter (Signed)
Rx metformin sent to pt pharmacy. 

## 2020-04-12 ENCOUNTER — Encounter: Payer: Self-pay | Admitting: Medical

## 2020-04-13 ENCOUNTER — Telehealth: Payer: Self-pay | Admitting: Medical

## 2020-04-13 MED ORDER — LOSARTAN POTASSIUM 25 MG PO TABS
ORAL_TABLET | ORAL | 0 refills | Status: DC
Start: 2020-04-13 — End: 2020-04-19

## 2020-04-13 NOTE — Telephone Encounter (Signed)
Rx losartan sent to pt pharmacy. 

## 2020-04-19 ENCOUNTER — Telehealth: Payer: Self-pay | Admitting: Medical

## 2020-04-19 MED ORDER — LOSARTAN POTASSIUM 25 MG PO TABS: ORAL_TABLET | ORAL | 0 refills | Status: DC

## 2020-04-19 NOTE — Telephone Encounter (Signed)
Rx resent to pharmacy

## 2020-05-07 ENCOUNTER — Encounter: Payer: Self-pay | Admitting: Medical

## 2020-05-15 ENCOUNTER — Other Ambulatory Visit: Payer: Self-pay | Admitting: Medical

## 2020-08-02 ENCOUNTER — Encounter: Payer: Self-pay | Admitting: Medical

## 2020-08-06 ENCOUNTER — Encounter: Payer: No Typology Code available for payment source | Admitting: Medical

## 2020-08-13 ENCOUNTER — Ambulatory Visit (INDEPENDENT_AMBULATORY_CARE_PROVIDER_SITE_OTHER): Payer: Self-pay | Admitting: Medical

## 2020-08-13 ENCOUNTER — Other Ambulatory Visit: Payer: Self-pay

## 2020-08-13 ENCOUNTER — Ambulatory Visit (HOSPITAL_BASED_OUTPATIENT_CLINIC_OR_DEPARTMENT_OTHER)
Admission: RE | Admit: 2020-08-13 | Discharge: 2020-08-13 | Disposition: A | Discharge status: Home / Self Care | Payer: Self-pay | Source: Ambulatory Visit | Attending: Medical | Admitting: Medical

## 2020-08-13 ENCOUNTER — Encounter: Payer: Self-pay | Admitting: Medical

## 2020-08-13 ENCOUNTER — Telehealth: Payer: Self-pay | Admitting: Medical

## 2020-08-13 ENCOUNTER — Other Ambulatory Visit: Payer: Self-pay | Admitting: Medical

## 2020-08-13 VITALS — BP 138/81 | HR 73 | Temp 98.1°F | Resp 18 | Ht 64.0 in | Wt 216.0 lb

## 2020-08-13 DIAGNOSIS — R739 Hyperglycemia, unspecified: Secondary | ICD-10-CM

## 2020-08-13 DIAGNOSIS — J4520 Mild intermittent asthma, uncomplicated: Secondary | ICD-10-CM | POA: Insufficient documentation

## 2020-08-13 DIAGNOSIS — Z20822 Contact with and (suspected) exposure to covid-19: Secondary | ICD-10-CM

## 2020-08-13 DIAGNOSIS — Z01818 Encounter for other preprocedural examination: Secondary | ICD-10-CM

## 2020-08-13 DIAGNOSIS — Z113 Encounter for screening for infections with a predominantly sexual mode of transmission: Secondary | ICD-10-CM

## 2020-08-13 DIAGNOSIS — I10 Essential (primary) hypertension: Secondary | ICD-10-CM

## 2020-08-13 LAB — CBC WITH DIFFERENTIAL/PLATELET
Basophils Absolute: 0 10*3/uL (ref 0.0–0.1)
Basophils Relative: 0.4 % (ref 0.0–3.0)
Eosinophils Absolute: 0 10*3/uL (ref 0.0–0.7)
Eosinophils Relative: 0.5 % (ref 0.0–5.0)
HCT: 38.5 % (ref 36.0–46.0)
Hemoglobin: 12.4 g/dL (ref 12.0–15.0)
Lymphocytes Relative: 26.1 % (ref 12.0–46.0)
Lymphs Abs: 2.4 10*3/uL (ref 0.7–4.0)
MCHC: 32.2 g/dL (ref 30.0–36.0)
MCV: 83 fl (ref 78.0–100.0)
Monocytes Absolute: 0.5 10*3/uL (ref 0.1–1.0)
Monocytes Relative: 5 % (ref 3.0–12.0)
Neutro Abs: 6.3 10*3/uL (ref 1.4–7.7)
Neutrophils Relative %: 68 % (ref 43.0–77.0)
Platelets: 225 10*3/uL (ref 150.0–400.0)
RBC: 4.64 Mil/uL (ref 3.87–5.11)
RDW: 13.5 % (ref 11.5–15.5)
WBC: 9.3 10*3/uL (ref 4.0–10.5)

## 2020-08-13 LAB — COMPREHENSIVE METABOLIC PANEL
ALT: 71 U/L — ABNORMAL HIGH (ref 0–35)
AST: 43 U/L — ABNORMAL HIGH (ref 0–37)
Albumin: 4.3 g/dL (ref 3.5–5.2)
Alkaline Phosphatase: 141 U/L — ABNORMAL HIGH (ref 39–117)
BUN: 8 mg/dL (ref 6–23)
CO2: 31 mEq/L (ref 19–32)
Calcium: 9.7 mg/dL (ref 8.4–10.5)
Chloride: 102 mEq/L (ref 96–112)
Creatinine, Ser: 0.69 mg/dL (ref 0.40–1.20)
GFR: 109.35 mL/min (ref >60.00)
Glucose, Bld: 96 mg/dL (ref 70–99)
Potassium: 3.9 mEq/L (ref 3.5–5.1)
Sodium: 139 mEq/L (ref 135–145)
Total Bilirubin: 0.4 mg/dL (ref 0.2–1.2)
Total Protein: 7.8 g/dL (ref 6.0–8.3)

## 2020-08-13 LAB — PROTIME-INR
INR: 1 ratio (ref 0.8–1.0)
Prothrombin Time: 11.7 s (ref 9.6–13.1)

## 2020-08-13 LAB — HCG, QUANTITATIVE, PREGNANCY: Quantitative HCG: 1.73 m[IU]/mL

## 2020-08-13 LAB — HEMOGLOBIN A1C: Hgb A1c MFr Bld: 6.3 % (ref 4.6–6.5)

## 2020-08-13 LAB — APTT: aPTT: 35.2 s — ABNORMAL HIGH (ref 23.4–32.7)

## 2020-08-13 MED ORDER — ALBUTEROL SULFATE HFA 108 (90 BASE) MCG/ACT IN AERS: 2.0000 | INHALATION_SPRAY | Freq: Four times a day (QID) | RESPIRATORY_TRACT | 3 refills | Status: DC | PRN

## 2020-08-13 MED ORDER — FLUTICASONE-SALMETEROL 500-50 MCG/DOSE IN AEPB: 1.0000 | INHALATION_SPRAY | Freq: Two times a day (BID) | RESPIRATORY_TRACT | 11 refills | Status: AC

## 2020-08-13 MED ORDER — METFORMIN HCL 500 MG PO TABS: 500.0000 mg | ORAL_TABLET | Freq: Two times a day (BID) | ORAL | 11 refills | Status: DC

## 2020-08-13 MED ORDER — HYDROCHLOROTHIAZIDE 25 MG PO TABS: 25.0000 mg | ORAL_TABLET | Freq: Every day | ORAL | 11 refills | Status: DC

## 2020-08-13 MED ORDER — LOSARTAN POTASSIUM 25 MG PO TABS: ORAL_TABLET | ORAL | 11 refills | Status: DC

## 2020-08-13 MED FILL — HYDROCHLOROTHIAZIDE 25 MG T: 25 | 90 days supply | Qty: 90 | Fill #0

## 2020-08-13 MED FILL — ALBUTEROL SULFATE HFA 108 (: 108 (90 BAS | 25 days supply | Qty: 18 | Fill #0

## 2020-08-13 MED FILL — METFORMIN HCL 500 MG TABS: 500 | 30 days supply | Qty: 60 | Fill #2

## 2020-08-13 MED FILL — LOSARTAN POTASSIUM 25 MG TA: 25 | 90 days supply | Qty: 90 | Fill #0

## 2020-08-13 NOTE — Patient Instructions (Signed)
We did preop physical exam today and will get associated labs requested by the surgeon that will do your elective surgery.  ekg normal sinus rhythm.  Your blood pressure is good today. Refilled both bp meds losartan and hctz.  You have mild intermittent asthma in the past.  None recently but will go ahead and get chest x-ray in light of your upcoming surgery. Refilled advair and albuterol to restart if needed.  For elevated sugar a1c 6.5 will get a1c and refill metformin according to level.  Mend that you follow through with postop monitor plan close to [facility] since surgeon will be available.  If you have later complications it would be challenge to get you followed up locally with other plastic surgeon.  Follow up or as needed

## 2020-08-13 NOTE — Telephone Encounter (Signed)
Rx metformin sent to pt pharmacy. 

## 2020-08-13 NOTE — Progress Notes (Signed)
Subjective:    Patient ID: Destiny Goodman, female    DOB: August 09, 1981, 39 y.o.   MRN: [id number]  HPI  Pt in for preop.  Pt has gotten some plastic surgery in the past. Pt has had gluteoplasty but lift in the past  At the time that gluteoplasty was done was going to get liposuction on abdomen and thigh but she decided to do in separate procedure.  Pt wants to have surgery with Dr. Lu Duffel number [phone].    Pt wants to have surgery September 19, 2020   Pt states this upcoming procedure will be extended tummy tuck.  Pt surgeon has own surgical center. Pt will get screened for covid before procedure.  Pt has hx of asthma on chart review. She states usually has to use inhalers at beginning of summer but non recently.    Pt sugar has been in prediabetic range. On metformin.  LMP-hysterectomy.  Pt tolerated general anesthesia well for rototor cuff, bicept tendon repair, breast reduction and hysterectomy.  No family history of sudden death young age.   Pt work for [employer].  Pt has htn.. She is on hctz and losartan in past. Ran out of med one month ago.  No hx of dvt or pe.      Review of Systems  Constitutional: Negative for chills, fatigue and fever.  HENT: Negative for congestion and ear pain.   Respiratory: Negative for cough, chest tightness, shortness of breath and wheezing.   Cardiovascular: Negative for chest pain and palpitations.  Gastrointestinal: Negative for abdominal pain, constipation, diarrhea and nausea.  Endocrine: Negative for polydipsia, polyphagia and polyuria.  Genitourinary: Negative for dysuria and frequency.  Musculoskeletal: Negative for back pain.  Skin: Negative for rash.  Neurological: Negative for dizziness, weakness, numbness and headaches.  Hematological: Negative for adenopathy. Does not bruise/bleed easily.  Psychiatric/Behavioral: Negative for behavioral problems, confusion and dysphoric mood.    Past Medical History:    Diagnosis Date  . Anemia   . Asthma   . Hypertension   . Stomach ulcer      Social History   Socioeconomic History  . Marital status: Single    Spouse name: Not on file  . Number of children: 1  . Years of education: Not on file  . Highest education level: Not on file  Occupational History  . Occupation: Diplomatic Services operational officer  Tobacco Use  . Smoking status: Never Smoker  . Smokeless tobacco: Never Used  Vaping Use  . Vaping Use: Never used  Substance and Sexual Activity  . Alcohol use: Yes    Comment: social/rare.   . Drug use: Never  . Sexual activity: Yes  Other Topics Concern  . Not on file  Social History Narrative  . Not on file   Social Determinants of Health   Financial Resource Strain:   . Difficulty of Paying Living Expenses: Not on file  Food Insecurity:   . Worried About Programme researcher, broadcasting/film/video in the Last Year: Not on file  . Ran Out of Food in the Last Year: Not on file  Transportation Needs:   . Lack of Transportation (Medical): Not on file  . Lack of Transportation (Non-Medical): Not on file  Physical Activity:   . Days of Exercise per Week: Not on file  . Minutes of Exercise per Session: Not on file  Stress:   . Feeling of Stress : Not on file  Social Connections:   . Frequency of Communication with Friends and Family: Not on  file  . Frequency of Social Gatherings with Friends and Family: Not on file  . Attends Religious Services: Not on file  . Active Member of Clubs or Organizations: Not on file  . Attends Banker Meetings: Not on file  . Marital Status: Not on file  Intimate Partner Violence:   . Fear of Current or Ex-Partner: Not on file  . Emotionally Abused: Not on file  . Physically Abused: Not on file  . Sexually Abused: Not on file    Past Surgical History:  Procedure Laterality Date  . ABDOMINAL HYSTERECTOMY      No family history on file.  Allergies  Allergen Reactions  . Cortisone Hives  . Penicillins Hives and  Shortness Of Breath  . Aspirin Hives and Other (See Comments)    SENSITIVITY SENSITIVITY     Current Outpatient Medications on File Prior to Visit  Medication Sig Dispense Refill  . metFORMIN (GLUCOPHAGE) 500 MG tablet Take 1 tablet (500 mg total) by mouth 2 (two) times daily with a meal. 60 tablet 11  . azithromycin (ZITHROMAX) 250 MG tablet Take 2 tablets by mouth on day 1, followed by 1 tablet by mouth daily for 4 days. (Patient not taking: Reported on 03/04/2020) 6 tablet 0  . HYDROcodone-homatropine (HYCODAN) 5-1.5 MG/5ML syrup Take 5 mLs by mouth every 6 (six) hours as needed. (Patient not taking: Reported on 08/13/2020) 100 mL 0  . loratadine (CLARITIN) 10 MG tablet Take 1 tablet (10 mg total) by mouth daily. (Patient not taking: Reported on 08/13/2020) 30 tablet 11  . triamcinolone cream (KENALOG) 0.1 % Apply 1 application topically 2 (two) times daily. (Patient not taking: Reported on 08/13/2020) 30 g 3   No current facility-administered medications on file prior to visit.    BP 138/81   Pulse 73   Temp 98.1 F (36.7 C) (Oral)   Resp 18   Ht 5\' 4"  (1.626 m)   Wt 216 lb (98 kg)   SpO2 100%   BMI 37.08 kg/m      Objective:   Physical Exam  General Mental Status- Alert. General Appearance- Not in acute distress.   Skin General: Color- Normal Color. Moisture- Normal Moisture.  Neck Carotid Arteries- Normal color. Moisture- Normal Moisture. No carotid bruits. No JVD.  Chest and Lung Exam Auscultation: Breath Sounds:-Normal.  Cardiovascular Auscultation:Rythm- Regular. Murmurs & Other Heart Sounds:Auscultation of the heart reveals- No Murmurs.  Abdomen Inspection:-Inspeection Normal. Palpation/Percussion:Note:No mass. Palpation and Percussion of the abdomen reveal- Non Tender, Non Distended + BS, no rebound or guarding.    Neurologic Cranial Nerve exam:- CN III-XII intact(No nystagmus), symmetric smile. Strength:- 5/5 equal and symmetric strength  both upper and lower extremities.  Lower ext- no pedal edema.      Assessment & Plan:  We did preop physical exam today and will get associated labs requested by the surgeon that will do your elective surgery.  ekg normal sinus rhythm.  Your blood pressure is good today. Refilled both bp meds losartan and hctz.  You have mild intermittent asthma in the past.  None recently but will go ahead and get chest x-ray in light of your upcoming surgery. Refilled advair and albuterol to restart if needed.  For elevated sugar a1c 6.5 will get a1c and refill metformin according to level.  Mend that you follow through with postop monitor plan close to [facility] since surgeon will be available.  If you have later complications it would be challenge to get you followed  up locally with other plastic surgeon.  Follow up or as needed  Time spent with patient today was 40+  minutes which consisted of chart review, discussing diagnosis, work up,treatment, preop requirement and documentation.(did pre-op as well as addressed chronic med problems as has not been seen recently.)

## 2020-08-14 ENCOUNTER — Telehealth: Payer: Self-pay | Admitting: *Deleted

## 2020-08-14 ENCOUNTER — Telehealth: Payer: Self-pay | Admitting: Medical

## 2020-08-14 LAB — HIV ANTIBODY (ROUTINE TESTING W REFLEX): HIV 1&2 Ab, 4th Generation: NONREACTIVE

## 2020-08-14 LAB — SARS-COV-2, NAA 2 DAY TAT

## 2020-08-14 LAB — NOVEL CORONAVIRUS, NAA: SARS-CoV-2, NAA: NOT DETECTED

## 2020-08-14 MED ORDER — METFORMIN HCL 500 MG PO TABS: 500.0000 mg | ORAL_TABLET | Freq: Two times a day (BID) | ORAL | 11 refills | Status: AC

## 2020-08-14 NOTE — Telephone Encounter (Signed)
Sent in 2 tab po bid rx to pt pharmacy.

## 2020-08-14 NOTE — Telephone Encounter (Signed)
Destiny Goodman long faxed over a medication change request stating that patient stated that she is taking 2 tabs bid of metformin.  Need new script.    Is this correct or do you want to keep as you sent in which is 1 tab bid?

## 2020-08-14 NOTE — Telephone Encounter (Signed)
Rx metformin sent to pt pharmacy. 

## 2020-08-18 ENCOUNTER — Telehealth: Payer: Self-pay | Admitting: Medical

## 2020-08-18 NOTE — Telephone Encounter (Signed)
Pt preop form has been filled out and medical clearance letter written. 2 things on form need to fill out. FH- any significant illness parents or sibling. Social history- works at [employer] 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  This form needs to be faxed tomorrow. Very important.

## 2020-08-19 NOTE — Telephone Encounter (Signed)
Left message on machine to call back  

## 2020-08-22 NOTE — Telephone Encounter (Signed)
Per Keturah Barre faxed paperwork already.

## 2020-08-26 ENCOUNTER — Telehealth: Payer: Self-pay | Admitting: Medical

## 2020-08-26 NOTE — Telephone Encounter (Signed)
Ekg faxed.

## 2020-08-26 NOTE — Telephone Encounter (Signed)
Patient is requesting EKG to be sent to Fax Number : [phone], patient states this is her plastic surgeon and they need a copy and it was never sent.   Please advise

## 2021-07-16 IMAGING — US US ABDOMEN COMPLETE
1 series · 14 of 25 positions shown · non-contrast
Comparison: None.

CLINICAL DATA: Elevated liver enzymes.

EXAM:
ABDOMEN ULTRASOUND COMPLETE

[Series 1: us abdomen complete · 14 of 121 slices shown]
[im 1/121]
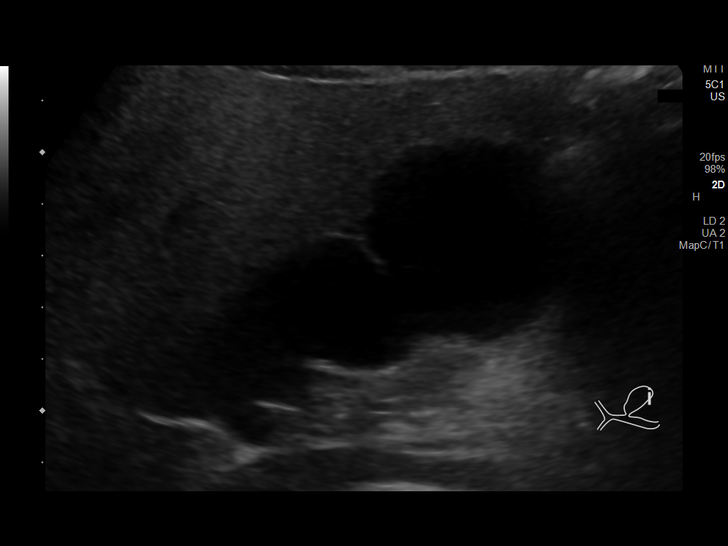
[im 11/121]
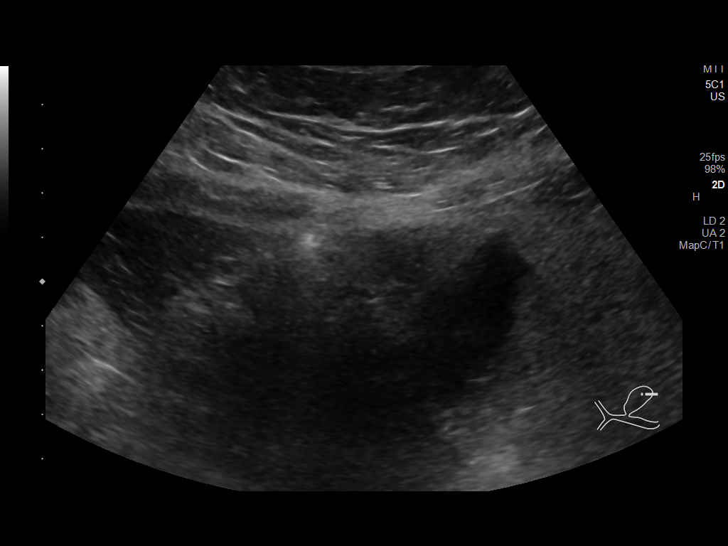
[im 21/121]
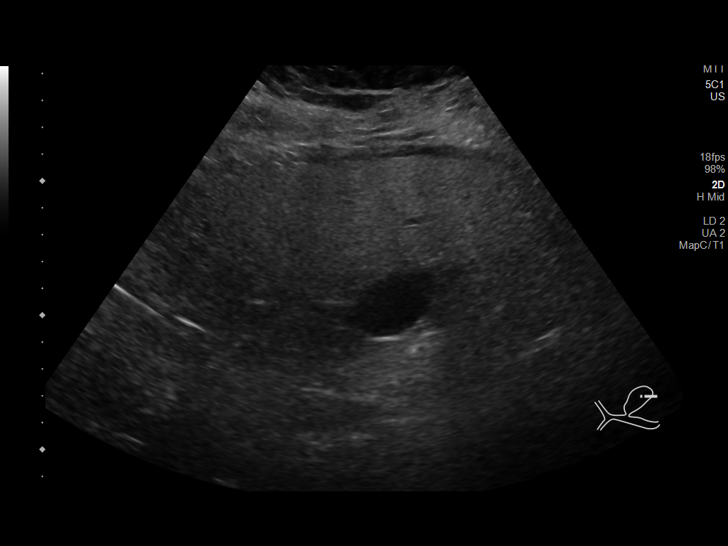
[im 31/121]
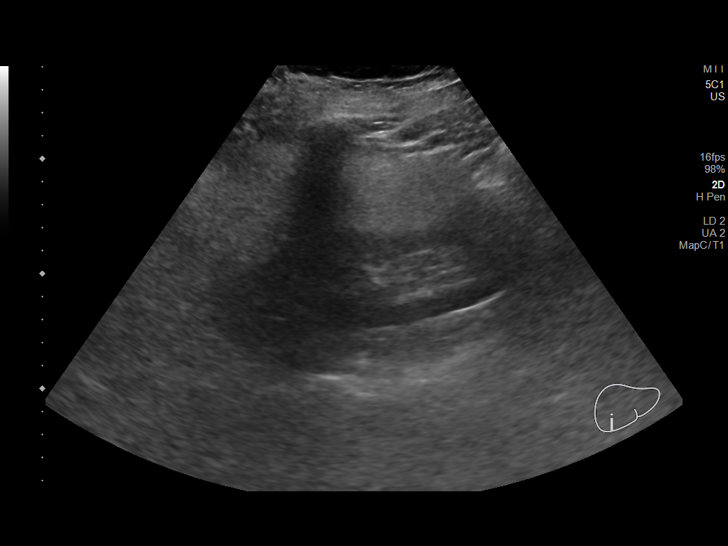
[im 41/121]
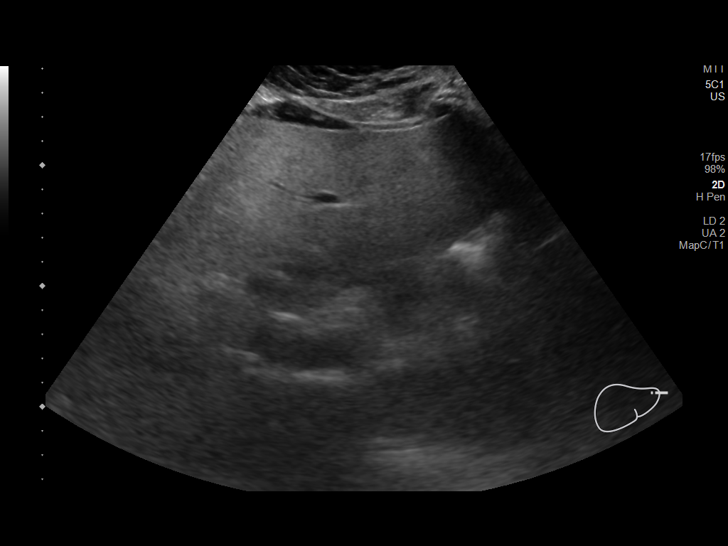
[im 46/121]
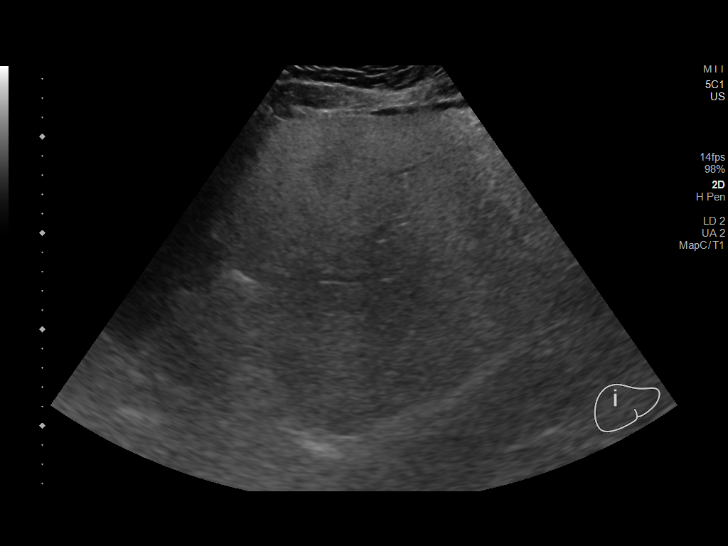
[im 56/121]
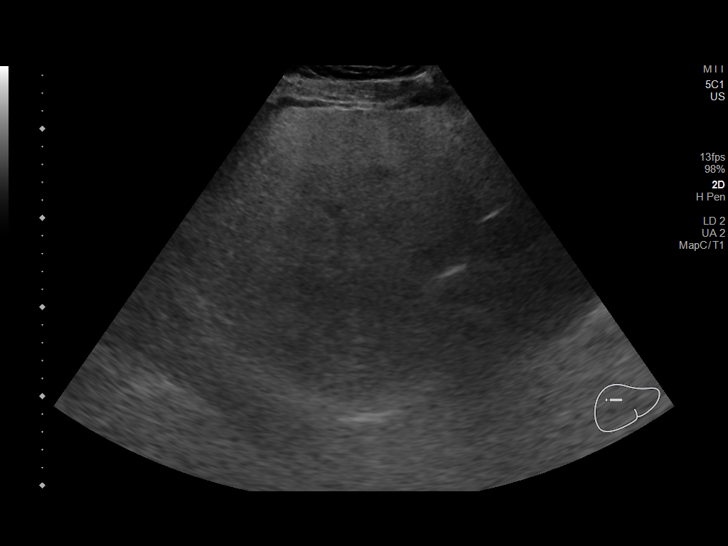
[im 66/121]
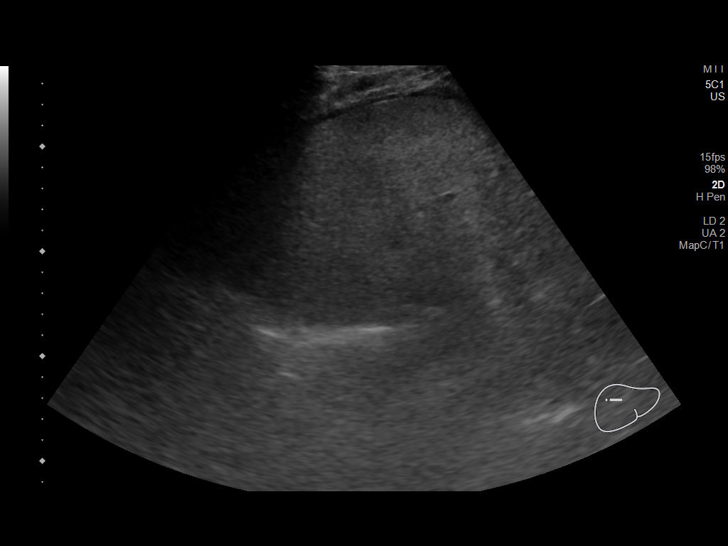
[im 76/121]
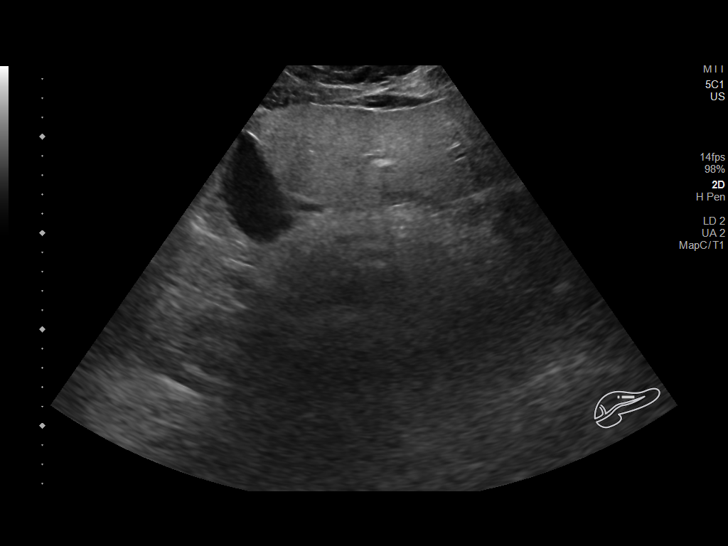
[im 81/121]
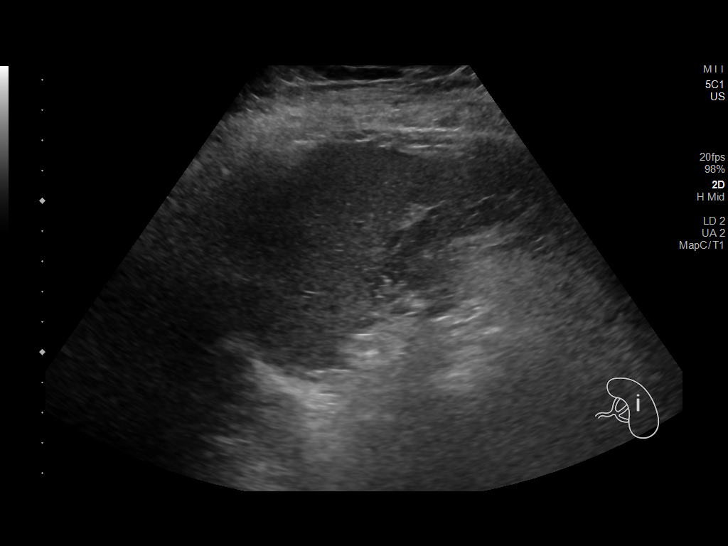
[im 91/121]
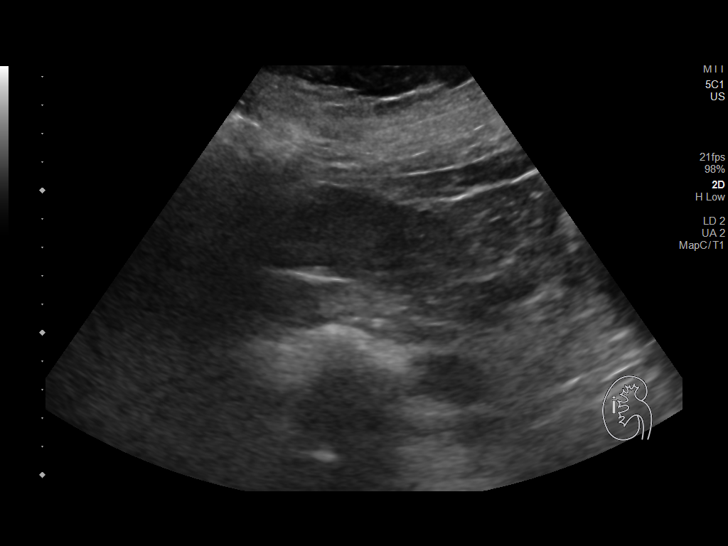
[im 101/121]
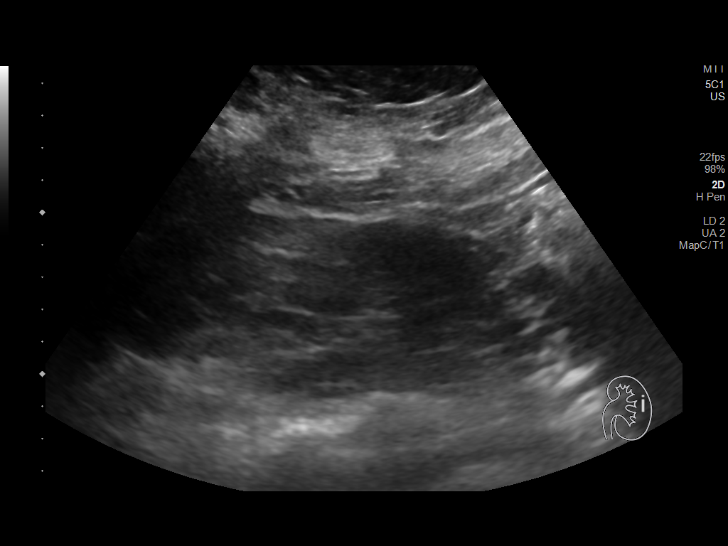
[im 111/121]
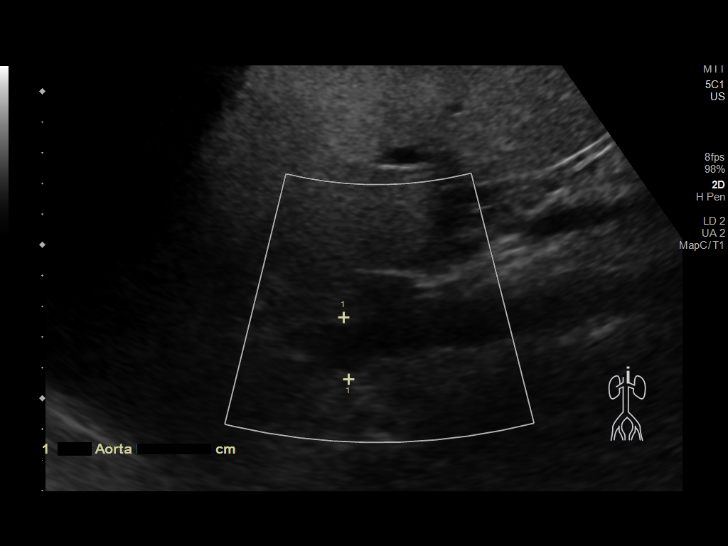
[im 121/121]
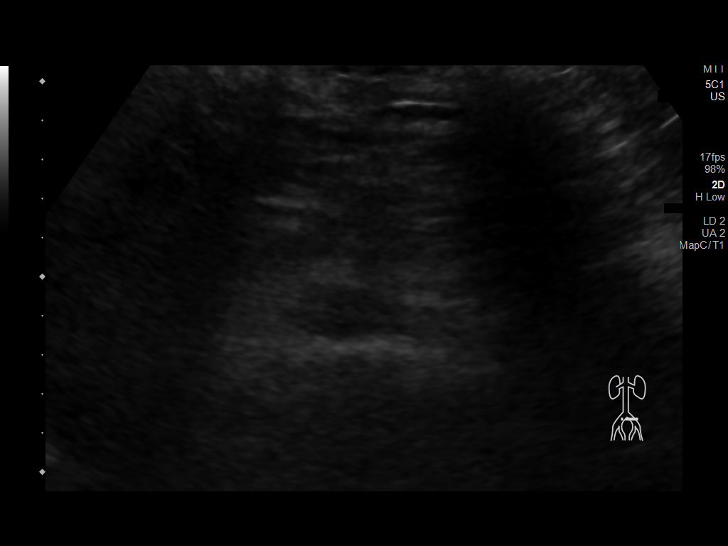

[14 of 25 positions shown; findings below may reference images not displayed]

FINDINGS: Gallbladder: No gallstones or wall thickening visualized. No
sonographic Murphy sign noted by sonographer.

Common bile duct: Diameter: 4.3 mm, normal.

Liver: Diffuse increased echogenicity of the liver parenchyma with a
small focal area of fatty sparing measuring 1.4 cm. Portal vein is
patent on color Doppler imaging with normal direction of blood flow
towards the liver.

IVC: No abnormality visualized.

Pancreas: Visualized portion unremarkable.

Spleen: Size and appearance within normal limits.

Right Kidney: Length: 12.9 cm. Echogenicity within normal limits. No
mass or hydronephrosis visualized.

Left Kidney: Length: 10.8 cm. 2.0 cm cyst in the upper pole
echogenicity within normal limits. No solid mass or hydronephrosis
visualized.

Abdominal aorta: No aneurysm visualized.

Other findings: None.
IMPRESSION: 1. Hepatic steatosis.
2. Otherwise normal exam.

## 2022-09-27 IMAGING — DX DG CHEST 2V
2 series · 2 of 2 positions shown · non-contrast
Comparison: 05/15/2019.

CLINICAL DATA: History of asthma.  Preoperative exam.

EXAM:
CHEST - 2 VIEW

[chest pa]
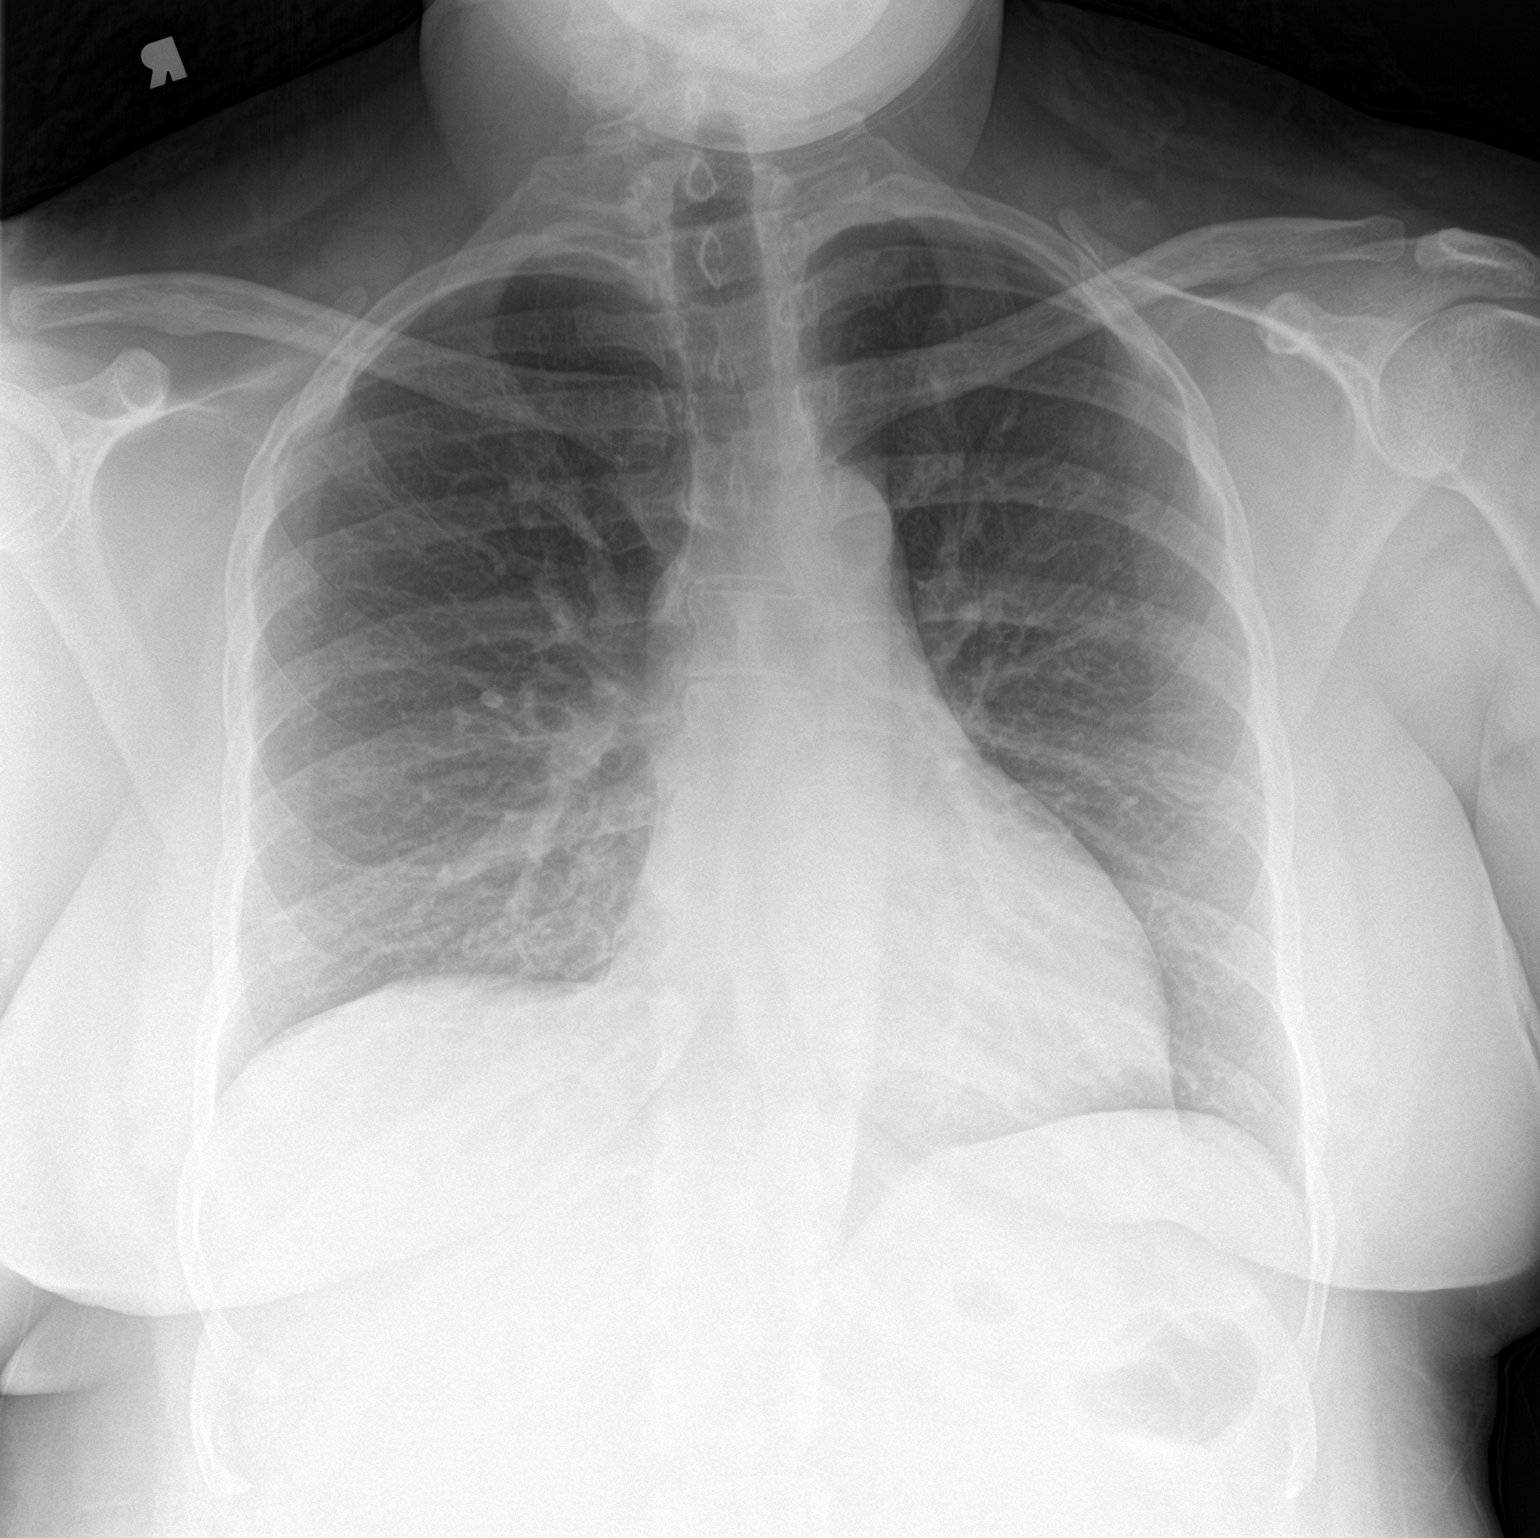

[chest lat]
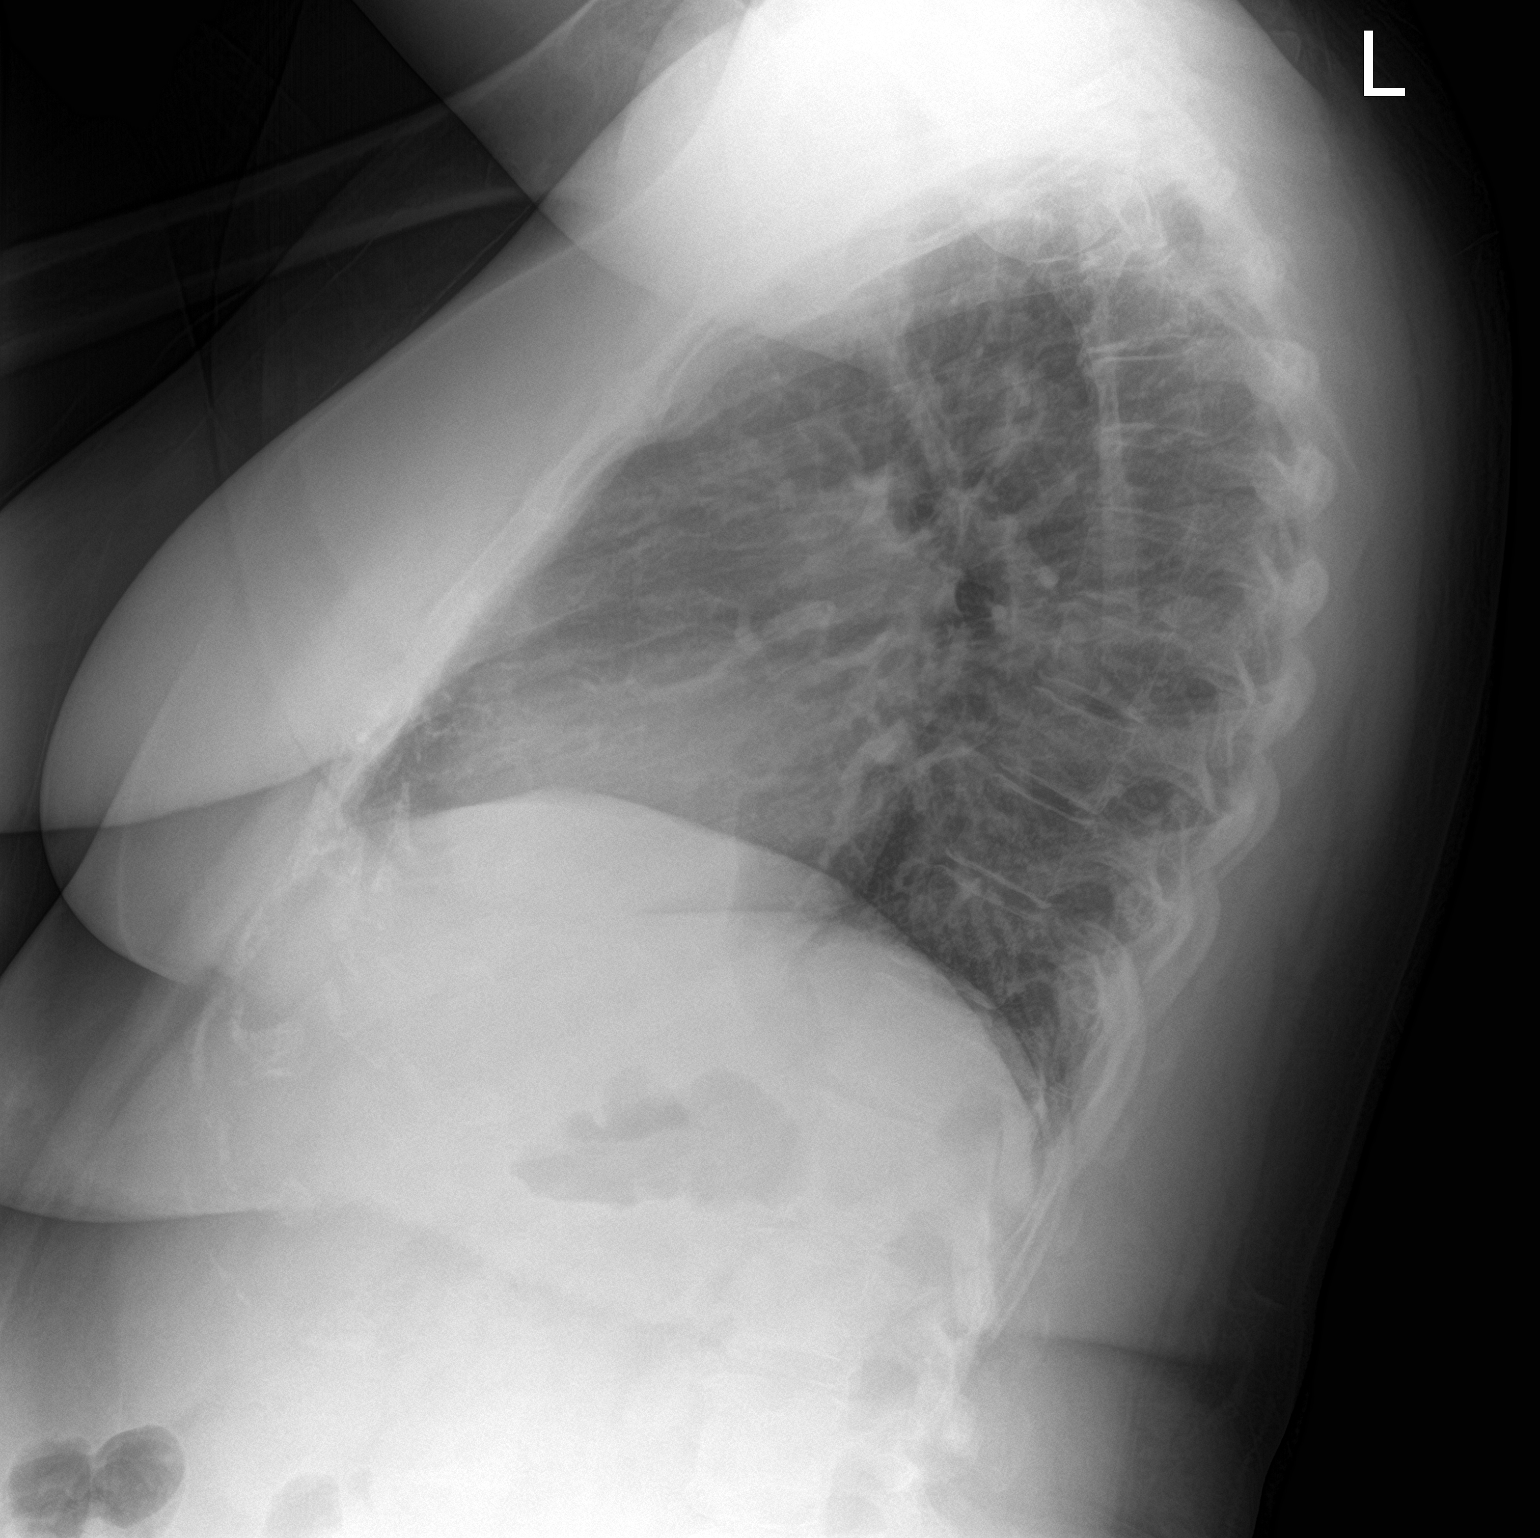

[2 of 2 positions shown; findings below may reference images not displayed]

FINDINGS: Mediastinum and hilar structures normal. Heart size normal. No focal
infiltrate. No pleural effusion or pneumothorax. Degenerative
changes scoliosis thoracic spine.
IMPRESSION: No acute cardiopulmonary disease.
